# Patient Record
Sex: Male | Born: 1995 | Race: White | Hispanic: No | Marital: Single | Smoking: Never smoker
Health system: Southern US, Community
[De-identification: ages and names within clinical notes are randomized; demographics above are authoritative.]

## PROBLEM LIST (undated history)

## (undated) DIAGNOSIS — E119 Type 2 diabetes mellitus without complications: Secondary | ICD-10-CM

## (undated) DIAGNOSIS — I10 Essential (primary) hypertension: Secondary | ICD-10-CM

---
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
## 2015-08-24 ENCOUNTER — Encounter (HOSPITAL_COMMUNITY): Payer: Self-pay | Admitting: Emergency Medicine

## 2015-08-24 ENCOUNTER — Emergency Department (HOSPITAL_COMMUNITY)
Admission: EM | Admit: 2015-08-24 | Discharge: 2015-08-24 | Disposition: A | Discharge status: Home / Self Care | Payer: Self-pay | Attending: Emergency Medicine | Admitting: Emergency Medicine

## 2015-08-24 DIAGNOSIS — R079 Chest pain, unspecified: Secondary | ICD-10-CM | POA: Insufficient documentation

## 2015-08-24 DIAGNOSIS — R630 Anorexia: Secondary | ICD-10-CM | POA: Insufficient documentation

## 2015-08-24 DIAGNOSIS — R0682 Tachypnea, not elsewhere classified: Secondary | ICD-10-CM | POA: Insufficient documentation

## 2015-08-24 DIAGNOSIS — R112 Nausea with vomiting, unspecified: Secondary | ICD-10-CM | POA: Insufficient documentation

## 2015-08-24 LAB — COMPREHENSIVE METABOLIC PANEL
ALBUMIN: 4.7 g/dL (ref 3.5–5.0)
ALK PHOS: 47 U/L (ref 38–126)
ALT: 17 U/L (ref 17–63)
ANION GAP: 18 — AB (ref 5–15)
AST: 28 U/L (ref 15–41)
BILIRUBIN TOTAL: 0.7 mg/dL (ref 0.3–1.2)
BUN: 8 mg/dL (ref 6–20)
CALCIUM: 10 mg/dL (ref 8.9–10.3)
CO2: 17 mmol/L — ABNORMAL LOW (ref 22–32)
Chloride: 105 mmol/L (ref 101–111)
Creatinine, Ser: 1.1 mg/dL (ref 0.61–1.24)
Glucose, Bld: 146 mg/dL — ABNORMAL HIGH (ref 65–99)
POTASSIUM: 3.7 mmol/L (ref 3.5–5.1)
Sodium: 140 mmol/L (ref 135–145)
TOTAL PROTEIN: 7.8 g/dL (ref 6.5–8.1)

## 2015-08-24 LAB — CBC
HEMATOCRIT: 46 % (ref 39.0–52.0)
HEMOGLOBIN: 15.7 g/dL (ref 13.0–17.0)
MCH: 29.1 pg (ref 26.0–34.0)
MCHC: 34.1 g/dL (ref 30.0–36.0)
MCV: 85.2 fL (ref 78.0–100.0)
PLATELETS: 305 10*3/uL (ref 150–400)
RBC: 5.4 MIL/uL (ref 4.22–5.81)
RDW: 13.5 % (ref 11.5–15.5)
WBC: 22 10*3/uL — AB (ref 4.0–10.5)

## 2015-08-24 LAB — URINE MICROSCOPIC-ADD ON
RBC / HPF: NONE SEEN RBC/hpf (ref 0–5)
SQUAMOUS EPITHELIAL / LPF: NONE SEEN
WBC UA: NONE SEEN WBC/hpf (ref 0–5)

## 2015-08-24 LAB — RAPID URINE DRUG SCREEN, HOSP PERFORMED
Amphetamines: NOT DETECTED
BARBITURATES: NOT DETECTED
Benzodiazepines: NOT DETECTED
COCAINE: POSITIVE — AB
Opiates: NOT DETECTED
Tetrahydrocannabinol: POSITIVE — AB

## 2015-08-24 LAB — URINALYSIS, ROUTINE W REFLEX MICROSCOPIC
Bilirubin Urine: NEGATIVE
GLUCOSE, UA: NEGATIVE mg/dL
Hgb urine dipstick: NEGATIVE
Ketones, ur: 40 mg/dL — AB
LEUKOCYTES UA: NEGATIVE
Nitrite: NEGATIVE
PROTEIN: 30 mg/dL — AB
Specific Gravity, Urine: 1.022 (ref 1.005–1.030)
pH: 7.5 (ref 5.0–8.0)

## 2015-08-24 LAB — I-STAT TROPONIN, ED: TROPONIN I, POC: 0 ng/mL (ref 0.00–0.08)

## 2015-08-24 LAB — LIPASE, BLOOD: Lipase: 30 U/L (ref 11–51)

## 2015-08-24 MED ORDER — ONDANSETRON 4 MG PO TBDP
ORAL_TABLET | ORAL | Status: AC
  Filled 2015-08-24: qty 1

## 2015-08-24 MED ORDER — SODIUM CHLORIDE 0.9 % IV BOLUS (SEPSIS)
1000.0000 mL | Freq: Once | INTRAVENOUS | Status: AC
  Administered 2015-08-24: 1000 mL via INTRAVENOUS

## 2015-08-24 MED ORDER — ONDANSETRON 4 MG PO TBDP
4.0000 mg | ORAL_TABLET | Freq: Once | ORAL | Status: AC
  Administered 2015-08-24: 4 mg via ORAL

## 2015-08-24 MED ORDER — ONDANSETRON 4 MG PO TBDP
ORAL_TABLET | ORAL | Status: AC
  Administered 2015-08-24: 4 mg via ORAL
  Filled 2015-08-24: qty 1

## 2015-08-24 MED ORDER — ONDANSETRON HCL 4 MG/2ML IJ SOLN
4.0000 mg | Freq: Once | INTRAMUSCULAR | Status: AC
  Administered 2015-08-24: 4 mg via INTRAVENOUS
  Filled 2015-08-24: qty 2

## 2015-08-24 MED ORDER — ONDANSETRON 4 MG PO TBDP
4.0000 mg | ORAL_TABLET | Freq: Once | ORAL | Status: AC | PRN
  Administered 2015-08-24: 4 mg via ORAL

## 2015-08-24 MED ORDER — PANTOPRAZOLE SODIUM 40 MG IV SOLR
40.0000 mg | Freq: Once | INTRAVENOUS | Status: AC
Start: 2015-08-24 — End: 2015-08-24
  Administered 2015-08-24: 40 mg via INTRAVENOUS
  Filled 2015-08-24: qty 40

## 2015-08-24 NOTE — ED Notes (Signed)
Pt sts abd pain and vomiting after drinking large amounts of ETOH last night

## 2015-08-24 NOTE — ED Notes (Signed)
Pt. Vomiting right after he took the Zofran. Continues to vomit.

## 2015-08-24 NOTE — ED Provider Notes (Signed)
CSN: [id number]     Arrival date & time 08/24/15  1159 History   First MD Initiated Contact with Patient 08/24/15 1501     Chief Complaint  Patient presents with  . Emesis     (Consider location/radiation/quality/duration/timing/severity/associated sxs/prior Treatment) HPI  Chase Jones is a 19 y.o. male  with no significant PMH who presents to the Emergency Department complaining of n/v, and generalized abdominal pain beginning this morning. Patient states he drank heavily for his 19st birthday last night, and awoke feeling dehydrated. Per patient, he has thrown up ~ 30-40 times today, but mostly dry-heaving - denies blood. Also admits to chest pain that occurs with emesis or heavy breathing. Denies any drug use last night or tonight.   History reviewed. No pertinent past medical history. History reviewed. No pertinent past surgical history. History reviewed. No pertinent family history. Social History  Substance Use Topics  . Smoking status: Never Smoker   . Smokeless tobacco: None  . Alcohol Use: Yes    Review of Systems  Constitutional: Positive for appetite change (Unable to tolerate PO). Negative for fever, chills and fatigue.  HENT: Negative for congestion, rhinorrhea and sore throat.   Eyes: Negative for visual disturbance.  Respiratory: Negative for cough, shortness of breath and wheezing.   Cardiovascular: Positive for chest pain. Negative for palpitations and leg swelling.  Gastrointestinal: Positive for nausea, vomiting and abdominal pain. Negative for diarrhea and constipation.  Endocrine: Negative for polydipsia and polyuria.  Musculoskeletal: Negative for myalgias, back pain, arthralgias and neck pain.  Skin: Negative for rash.  Neurological: Negative for dizziness, weakness and headaches.      Allergies  Review of patient's allergies indicates no known allergies.  Home Medications   Prior to Admission medications   Not on File   BP 93/38 mmHg   Pulse 108  Temp(Src) 97.7 F (36.5 C) (Oral)  Resp 20  SpO2 98% Physical Exam  Constitutional: He is oriented to person, place, and time. He appears well-developed and well-nourished.  Alert and in no acute distress  HENT:  Head: Normocephalic and atraumatic.  Cardiovascular: Normal rate, regular rhythm, normal heart sounds and intact distal pulses.  Exam reveals no gallop and no friction rub.   No murmur heard. Pulmonary/Chest: Breath sounds normal. No respiratory distress. He has no wheezes. He has no rales. He exhibits tenderness.  Tachypnea  Abdominal: He exhibits no mass. There is no rebound and no guarding.  Generalized abdominal tenderness Bowel sounds positive in all four quadrants  Musculoskeletal:  Moves all extremities well x4  Neurological: He is alert and oriented to person, place, and time.  Skin: Skin is warm and dry. No rash noted.  Psychiatric: He has a normal mood and affect. His behavior is normal. Judgment and thought content normal.  Nursing note and vitals reviewed.   ED Course  Procedures (including critical care time) Labs Review Labs Reviewed  COMPREHENSIVE METABOLIC PANEL - Abnormal; Notable for the following:    CO2 17 (*)    Glucose, Bld 146 (*)    Anion gap 18 (*)    All other components within normal limits  CBC - Abnormal; Notable for the following:    WBC 22.0 (*)    All other components within normal limits  URINALYSIS, ROUTINE W REFLEX MICROSCOPIC (NOT AT [facility]) - Abnormal; Notable for the following:    Ketones, ur 40 (*)    Protein, ur 30 (*)    All other components within normal limits  URINE MICROSCOPIC-ADD ON - Abnormal; Notable for the following:    Bacteria, UA RARE (*)    All other components within normal limits  URINE RAPID DRUG SCREEN, HOSP PERFORMED - Abnormal; Notable for the following:    Cocaine POSITIVE (*)    Tetrahydrocannabinol POSITIVE (*)    All other components within normal limits  LIPASE, BLOOD  I-STAT TROPOININ,  ED    Imaging Review No results found. I have personally reviewed and evaluated these images and lab results as part of my medical decision-making.   EKG Interpretation None      MDM   Final diagnoses:  Nausea and vomiting in adult  Upon exam, pt. Appears very agitated and uncomfortable- will add on UDS -- UDS + for THC, cocaine - EKG and trop ordered. Will give fluids and reassess.   5:14 PM - Patient re-evaluated, was sleeping upon entering room. Still nauseous but feels improved.  Trop 0.0, EKG reassuring.   6:37 PM - patient re-evaluated and much improved. Not nauseous. Tolerating PO. Will dc to home in good and stable condition   Patient discussed with Dr. Adela Lank who agrees with treatment plan.     Chase Picket Kaysha Parsell, PA-C 08/24/15 1842  Melene Plan, DO 08/24/15 2332

## 2015-08-24 NOTE — Discharge Instructions (Signed)
Continue usual home medications Rest, drink plenty of fluids Please follow up with your primary doctor in 2-3 days for discussion of your diagnoses and further evaluation after today's visit; if you do not have a primary care doctor use the resource guide provided to find one; Please return to the ER for chest pain, any new or worsening conditions, any additional concerns.   Emergency Department Resource Guide 1) Find a Doctor and Pay Out of Pocket Although you won't have to find out who is covered by your insurance plan, it is a good idea to ask around and get recommendations. You will then need to call the office and see if the doctor you have chosen will accept you as a new patient and what types of options they offer for patients who are self-pay. Some doctors offer discounts or will set up payment plans for their patients who do not have insurance, but you will need to ask so you aren't surprised when you get to your appointment.  2) Contact Your Local Health Department Not all health departments have doctors that can see patients for sick visits, but many do, so it is worth a call to see if yours does. If you don't know where your local health department is, you can check in your phone book. The CDC also has a tool to help you locate your state's health department, and many state websites also have listings of all of their local health departments.  3) Find a Walk-in Clinic If your illness is not likely to be very severe or complicated, you may want to try a walk in clinic. These are popping up all over the country in pharmacies, drugstores, and shopping centers. They're usually staffed by nurse practitioners or physician assistants that have been trained to treat common illnesses and complaints. They're usually fairly quick and inexpensive. However, if you have serious medical issues or chronic medical problems, these are probably not your best option.  No Primary Care Doctor: - Call Health  Connect at  [phone] - they can help you locate a primary care doctor that  accepts your insurance, provides certain services, etc. - Physician Referral Service- [phone]  Chronic Pain Problems: Organization         Address  Phone   Notes  [facility]  [phone] Patients need to be referred by their primary care doctor.   Medication Assistance: Organization         Address  Phone   Notes  [facility] [address] [place], [place] [address] [phone] --Must be a resident of [place] [facility] [place] -- Must have NO insurance coverage whatsoever (no Medicaid/ Medicare, etc.) -- The pt. MUST have a primary care doctor that directs their care regularly and follows them in the community   MedAssist  [phone]   [employer]  [phone]    Agencies that provide inexpensive medical care: Organization         Address  Phone   Notes  [facility]  [phone]   [facility]    [phone]   [facility] [address] [place], [place] [address] [phone]   [facility] [address], [place] [phone]   Planned Parenthood    [phone]   [facility]    [phone]   [facility]  [address], [place]  Phone:  [phone], Fax:  [phone] Hours of Operation:  9 am - 6 pm, M-F.  Also accepts Medicaid/Medicare and self-pay.  [facility] for [place], [address], [place] Phone: [phone], Fax: [phone]. Hours of Operation:  8:30 am - 5:30 pm, M-F.  Also accepts Medicaid and self-pay.  [facility] [place] [address] [place] Phone: [phone]   [place], [place], [place] [phone], Ext. [phone] Mondays & Thursdays: 7-9 AM.  First 15 patients are seen on a first come, first serve basis.     Christine Providers:  Organization         Address  Phone   Notes  [facility] [address], [place] [phone] Also accepts self-pay patients.  [facility] [id number] [place], [place]  [phone]   [place], [address], [place] [phone]   [facility] [address], [place] [phone]   Lucianne Lei [address], [place]   [phone] Only accepts [place] Access [place] patients after they have their name applied to their card.   Self-Pay (no insurance) in [facility]:  Organization         Address  Phone   Notes  Sickle Cell Patients, [facility] [place] [phone]   [facility] [place] [phone]   [facility] [place]  [place], [place],  [phone]   [facility]/Dr. Osei-Bonsu  [address] [place] or [address], [place] [phone] Phone number for both [place] and [place] locations is the same.  Urgent Medical and [facility] [address] [place] [phone]   [facility] [address] [place] or [address] [phone]   [facility] [address] [place] [phone], phone; [phone], fax Sees patients 1st and 3rd Saturday of every month.  Must not qualify for public or private insurance (i.e. Medicaid, Medicare, [place] Health Choice, Veterans' Benefits)  Household income should be no more than 200% of the poverty level The clinic cannot treat you if you are pregnant or think you are pregnant  Sexually transmitted diseases are not treated at the clinic.    Dental Care: Organization         Address  Phone  Notes  [facility]  Department of [facility] [place] [phone] Accepts children up to age 25 who are enrolled in [place] or [place]; pregnant women with a Medicaid card; and children who have applied for Medicaid or [place] Health Choice, but were declined, whose parents can pay a reduced fee at time of service.  [facility] Department of [facility]  [address], [place] [phone] Accepts children up to age 79 who are enrolled in [place] or [place]; pregnant women with a Medicaid card; and children who have applied for Medicaid or [place] Health Choice, but were declined, whose parents can pay a reduced fee at time of service.  [place] [facility] Access PRO[facility]  [place] [phone] Patients are seen by appointment  only. Walk-ins are not accepted. [place] will see patients 33 years of age and older. Monday - Tuesday (8am-5pm) Most Wednesdays (8:30-5pm) $30 per visit, cash only  [facility] Access [facility]  [address], [facility] [phone] Patients are seen by appointment only. Walk-ins are not accepted. [place] will see patients 12 years of age and older. One Wednesday Evening (Monthly: Volunteer Based).  $30 per visit, cash only  [place]  [phone] for adults; Children under age 69, call Graduate Pediatric Dentistry at [phone]. Children aged 51-14, please call [phone] to request a pediatric application.  Dental services are provided in all areas of dental care including fillings, crowns and bridges, complete and partial dentures, implants, gum treatment, root canals, and extractions. Preventive care is also provided. Treatment is provided to both adults and children. Patients are selected via a lottery and there is often a waiting list.   [facility] [address], [place]  [phone]  [email or web address]   [facility] [address] [place], [place] [phone], Ext. [phone] Second and Fourth Thursday of each month, opens at 6:30 AM; Clinic ends at 9 AM.  Patients are seen on a first-come first-served basis, and a limited number are seen during each clinic.   [facility]  [address] Hillard Danker [place], [place] [phone]   Eligibility Requirements You must have lived in [place], [place], or [place] counties for at least the last three months.   You cannot be eligible for state or federal sponsored [employer], including [employer], [place], or [employer].   You generally cannot be eligible for healthcare insurance through your employer.    How to apply: Eligibility screenings are held every Tuesday and Wednesday afternoon from 1:00 pm until 4:00 pm. You do not need an appointment for the interview!  [facility] [address] [place], [place]   [place]  [place] Department  [place]  [phone]    Behavioral Health Resources in the Community: Intensive Outpatient Programs Organization         Address  Phone  Notes  [place]. [address], [place], [place] [phone]   [facility]patient [address] [place], [place]   ADS: Alcohol & Drug Svcs [address] [place], [place]   [place] [address],  [place], [place] or [phone]   Substance Abuse Resources Organization         Address  Phone  Notes  Alcohol and Drug Services  [phone]   [place]  [phone]   The [place]   Chinita Pester  [phone]   Residential & Outpatient Substance Abuse Program  [phone]   Psychological Services Organization          Address  Phone  Notes  [facility] [place]  [place]  [phone]   [place] [address] [place] [phone] or [phone]    Mobile Crisis Teams Organization         Address  Phone  Notes  [facility], Mobile Crisis Care Unit  [phone]   [facility]ive Psychotherapeutic Services  [address] [place], [place]   [facility] [address], [place] [phone]  Self-Help/Support Groups Organization         Address  Phone             Notes  [facility] [place] - variety of support groups  [place] Call for more information  Narcotics Anonymous (NA), [facility]aring [facility] [address], [employer] [place]  2 meetings at this location   Special educational needs teacher         Address  Phone  Notes  [facility] Residential Treatment [place],    [place]  [phone]   [facility]  [address] [place] [id number], [place], [place]   [place], [place] [phone] Admissions: 8am-3pm M-F  Incentives Substance [place] [address]    [place], [place] [phone]   [facility] [address] [place], [place], [place]   [facility] [address]  [place], [place]   [facility] Programs - Intensive Outpatient [address] Kristeen [address], [place], [place]   [facility] [address]  [place], [place] [phone] or [phone]   [facility] Services (RTS) [address] [place], [place] Accepts Medicaid  [facility] [place] [address]  [place] [phone] Substance Abuse/Addiction Treatment   [facility] Organization         Address  Phone  Notes  [facility]  [phone]   Domenic Schwab, PhD [address] Arlis Porta [place], [place]   [phone] or [phone]   [place], [place] [phone]   [facility] [address] [place], [place] [phone] Insurance/Medicaid/sponsorship through [facility] and Families [address], [address]                                    [place], [place] [phone] [place] [address], [place] [phone]    Dr. Adele Schilder  [phone]   [facility] Dept. 1) [address]  2) [place] 3)  [address], [place] [phone]  [phone]   [place] [phone] or [phone] (After Hours)

## 2019-05-04 ENCOUNTER — Other Ambulatory Visit: Payer: Self-pay

## 2019-05-04 ENCOUNTER — Emergency Department (HOSPITAL_COMMUNITY)
Admission: EM | Admit: 2019-05-04 | Discharge: 2019-05-04 | Disposition: A | Discharge status: Home / Self Care | Payer: Self-pay | Attending: Emergency Medicine | Admitting: Emergency Medicine

## 2019-05-04 DIAGNOSIS — L299 Pruritus, unspecified: Secondary | ICD-10-CM | POA: Insufficient documentation

## 2019-05-04 DIAGNOSIS — R21 Rash and other nonspecific skin eruption: Secondary | ICD-10-CM | POA: Insufficient documentation

## 2019-05-04 DIAGNOSIS — L539 Erythematous condition, unspecified: Secondary | ICD-10-CM

## 2019-05-04 MED ORDER — IBUPROFEN 400 MG PO TABS
600.0000 mg | ORAL_TABLET | Freq: Once | ORAL | Status: AC
  Administered 2019-05-04: 600 mg via ORAL
  Filled 2019-05-04: qty 1

## 2019-05-04 MED ORDER — PREDNISONE 20 MG PO TABS: 40.0000 mg | ORAL_TABLET | Freq: Every day | ORAL | 0 refills | Status: AC

## 2019-05-04 MED ORDER — HYDROXYZINE HCL 25 MG PO TABS: 25.0000 mg | ORAL_TABLET | Freq: Four times a day (QID) | ORAL | 0 refills | Status: AC | PRN

## 2019-05-04 MED ORDER — CEPHALEXIN 250 MG PO CAPS
500.0000 mg | ORAL_CAPSULE | Freq: Once | ORAL | Status: AC
  Administered 2019-05-04: 500 mg via ORAL
  Filled 2019-05-04: qty 2

## 2019-05-04 MED ORDER — CEPHALEXIN 500 MG PO CAPS: 500.0000 mg | ORAL_CAPSULE | Freq: Four times a day (QID) | ORAL | 0 refills | Status: AC

## 2019-05-04 NOTE — ED Provider Notes (Signed)
[facility] Provider Note   CSN: [id number] Arrival date & time: 05/04/19  1832    History   Chief Complaint Chief Complaint  Patient presents with  . Insect Bite    HPI Chase Jones is a 23 y.o. male who presents today for evaluation of redness and pain in both his buttocks, primarily in the left buttock.  He reports that he got into the car and felt something bite him on the left buttock  He reports that it is red and painful.  States that it initially started on the left buttock however has rapidly spread and now includes the upper part of the right buttock.  He reports that it is very itchy and that he has been scratching the area.  He also reports pain.  He has not taken any ibuprofen or Tylenol.  He has tried a general over-the-counter anti-itch cream, he does not know the active ingredient, however that did not help.  He reports that he has significant anxiety at baseline and is not currently getting any treatment.  He denies shortness of breath or fever.  He states that he is unsure what it was that bit/stung him however he is certain he felt something when he sat down.  He says that when he looked at the area he did not see a stinger in the wound.  He denies any new exposures other than the anti-itch cream.  No new detergents, lotions.  He reports that his mother told him that when he was a child and had Benadryl he got more anxious.  He has not had Benadryl since he was a young child.  He denies shortness of breath, fevers.  He reports that it has spread since it happened.  He reports significant itching in the area.  Initially this started on the left buttock however has since spread to the top of the right buttock also.      HPI  No past medical history on file.  There are no active problems to display for this patient.   No past surgical history on file.      Home Medications    Prior to Admission medications    Medication Sig Start Date End Date Taking? Authorizing Provider  cephALEXin (KEFLEX) 500 MG capsule Take 1 capsule (500 mg total) by mouth 4 (four) times daily for 7 days. 05/04/19 05/11/19  Cristina GongHammond, Fritzi Scripter W, PA-C  hydrOXYzine (ATARAX/VISTARIL) 25 MG tablet Take 1-2 tablets (25-50 mg total) by mouth every 6 (six) hours as needed for anxiety or itching. 05/04/19   Cristina GongHammond, Shayley Medlin W, PA-C  predniSONE (DELTASONE) 20 MG tablet Take 2 tablets (40 mg total) by mouth daily for 5 days. 05/04/19 05/09/19  Cristina GongHammond, Gable Odonohue W, PA-C    Family History No family history on file.  Social History Social History   Tobacco Use  . Smoking status: Never Smoker  Substance Use Topics  . Alcohol use: Yes  . Drug use: Yes    Types: Marijuana     Allergies   Benadryl [diphenhydramine]   Review of Systems Review of Systems  Constitutional: Negative for chills and fever.  Respiratory: Negative for cough, chest tightness and shortness of breath.   Gastrointestinal: Negative for abdominal pain, diarrhea, nausea and vomiting.  Genitourinary: Negative for difficulty urinating, discharge, penile pain, penile swelling and scrotal swelling.  Musculoskeletal: Negative for arthralgias and myalgias.  Neurological: Negative for weakness and headaches.  Psychiatric/Behavioral: The patient is nervous/anxious (Baseline per patient).  All other systems reviewed and are negative.    Physical Exam Updated Vital Signs BP (!) 151/95 (BP Location: Right Arm)   Pulse 77   Temp 98.3 F (36.8 C) (Oral)   Resp 18   SpO2 99%   Physical Exam Vitals signs and nursing note reviewed.  Constitutional:      General: He is not in acute distress.    Appearance: He is well-developed. He is not diaphoretic.  HENT:     Head: Normocephalic and atraumatic.  Eyes:     General: No scleral icterus.       Right eye: No discharge.        Left eye: No discharge.     Conjunctiva/sclera: Conjunctivae normal.  Neck:      Musculoskeletal: Normal range of motion.  Cardiovascular:     Rate and Rhythm: Normal rate and regular rhythm.  Pulmonary:     Effort: Pulmonary effort is normal. No respiratory distress.     Breath sounds: No stridor.  Abdominal:     General: There is no distension.  Genitourinary:    Comments: Deferred.  Patient reports redness does not extend into perineal area, scrotum or penis. Musculoskeletal:        General: No deformity.  Skin:    General: Skin is warm and dry.     Comments: The left buttock is red with a subcentimeter shallow scab in the middle.  There is no palpable induration or fluctuance.  The general area is tender to palpation.  There is redness in the superior medial aspect of the right buttock in addition.  There is significant secondary excoriation with surrounding erythema.  There is no abnormal drainage from the area.  Neurological:     General: No focal deficit present.     Mental Status: He is alert.     Motor: No abnormal muscle tone.  Psychiatric:        Mood and Affect: Mood normal.        Behavior: Behavior normal.      ED Treatments / Results  Labs (all labs ordered are listed, but only abnormal results are displayed) Labs Reviewed - No data to display  EKG None  Radiology No results found.  Procedures Procedures (including critical care time)  Medications Ordered in ED Medications  cephALEXin (KEFLEX) capsule 500 mg (500 mg Oral Given 05/04/19 2130)  ibuprofen (ADVIL) tablet 600 mg (600 mg Oral Given 05/04/19 2130)     Initial Impression / Assessment and Plan / ED Course  I have reviewed the triage vital signs and the nursing notes.  Pertinent labs & imaging results that were available during my care of the patient were reviewed by me and considered in my medical decision making (see chart for details).       Patient presents today for evaluation of redness and pain in the left buttock that started after he sat in the car and felt  something poke/bite him.  On exam he does not have significant induration.  His left buttock is red and there is a subcentimeter central scab/ulcer.  There is no discharge from this area.  There is significant secondary excoriation with surrounding erythema.  Recommended Benadryl and prednisone.  Patient states that Benadryl had previously made him very anxious as a child, recommended trial of Benadryl, and he is given a prescription for Atarax in case he does not tolerate the Benadryl.  He also has significant secondary excoriation, while he does not have evidence of  infection currently I suspect that he would develop infection from the excoriation, therefore he is given a prescription for Keflex.  He reports his tetanus shot is up-to-date.  He denies any fevers.  He denies systemic symptoms.  He is afebrile not tachycardic or tachypneic, do not suspect Sirs/sepsis.  I suspect this is primarily an allergic process.  Return precautions were discussed with patient who states their understanding.  At the time of discharge patient denied any unaddressed complaints or concerns.  Patient is agreeable for discharge home.   Final Clinical Impressions(s) / ED Diagnoses   Final diagnoses:  Rash  Redness    ED Discharge Orders         Ordered    cephALEXin (KEFLEX) 500 MG capsule  4 times daily     05/04/19 2114    predniSONE (DELTASONE) 20 MG tablet  Daily     05/04/19 2114    hydrOXYzine (ATARAX/VISTARIL) 25 MG tablet  Every 6 hours PRN     05/04/19 2114           Ollen Gross 05/04/19 2337    Quintella Reichert, MD 05/05/19 0100

## 2019-05-04 NOTE — ED Notes (Signed)
Patient verbalizes understanding of discharge instructions. Opportunity for questioning and answers were provided. Armband removed by staff, pt discharged from ED.  

## 2019-05-04 NOTE — ED Notes (Signed)
Pt bitten possibly by a spider yesterday 5- 6 pm yesterday on left buttock.

## 2019-05-04 NOTE — ED Triage Notes (Signed)
Per pt he had gotten into his car yesterday and felt something bit him and it is on his left butt cheek. Pt says it is black and a rash around it. Pt said it is red and very sensitive.

## 2019-05-04 NOTE — Discharge Instructions (Addendum)
I have given you a prescription for steroids today.  Some common side effects include feelings of extra energy, feeling warm, increased appetite, and stomach upset.  If you are diabetic your sugars may run higher than usual.   Please take Ibuprofen (Advil, motrin) and Tylenol (acetaminophen) to relieve your pain.  You may take up to 600 MG (3 pills) of normal strength ibuprofen every 8 hours as needed.  In between doses of ibuprofen you make take tylenol, up to 1,000 mg (two extra strength pills).  Do not take more than 3,000 mg tylenol in a 24 hour period.  Please check all medication labels as many medications such as pain and cold medications may contain tylenol.  Do not drink alcohol while taking these medications.  Do not take other NSAID'S while taking ibuprofen (such as aleve or naproxen).  Please take ibuprofen with food to decrease stomach upset.  You may have diarrhea from the antibiotics.  It is very important that you continue to take the antibiotics even if you get diarrhea unless a medical professional tells you that you may stop taking them.  If you stop too early the bacteria you are being treated for will become stronger and you may need different, more powerful antibiotics that have more side effects and worsening diarrhea.  Please stay well hydrated and consider probiotics as they may decrease the severity of your diarrhea.   As we discussed I would recommend that you take Benadryl.  Please take 1 to 2 pills (25 to 50 mg) every 6 hours as needed.  In addition to this please take the steroid prescription.  I have given you a prescription for Atarax.  This is a medicine that can help with both anxiety and itching.  Please do not take this at the same time as you are taking the Benadryl.  If the Benadryl causes you too much anxiety then you may take the Atarax once the Benadryl wears off.  You may also apply calamine lotion to the red area.  As we discussed please take pictures to help  monitor and document the red area.  If you develop fevers, vomiting, muscle pains, or you have any concerns please seek additional medical care.  If there has not been a significant improvement in 2 days then please return for a wound check.

## 2023-05-07 ENCOUNTER — Encounter (HOSPITAL_COMMUNITY): Payer: Self-pay

## 2023-05-07 ENCOUNTER — Emergency Department (HOSPITAL_COMMUNITY)
Admission: EM | Admit: 2023-05-07 | Discharge: 2023-05-07 | Disposition: A | Discharge status: Home / Self Care | Payer: Medicaid Other | Attending: Emergency Medicine | Admitting: Emergency Medicine

## 2023-05-07 ENCOUNTER — Emergency Department (HOSPITAL_COMMUNITY): Payer: Medicaid Other

## 2023-05-07 ENCOUNTER — Other Ambulatory Visit: Payer: Self-pay

## 2023-05-07 DIAGNOSIS — R7309 Other abnormal glucose: Secondary | ICD-10-CM | POA: Insufficient documentation

## 2023-05-07 DIAGNOSIS — N201 Calculus of ureter: Secondary | ICD-10-CM

## 2023-05-07 DIAGNOSIS — R1031 Right lower quadrant pain: Secondary | ICD-10-CM | POA: Diagnosis present

## 2023-05-07 DIAGNOSIS — N202 Calculus of kidney with calculus of ureter: Secondary | ICD-10-CM | POA: Insufficient documentation

## 2023-05-07 DIAGNOSIS — N23 Unspecified renal colic: Secondary | ICD-10-CM

## 2023-05-07 HISTORY — DX: Type 2 diabetes mellitus without complications: E11.9

## 2023-05-07 HISTORY — DX: Essential (primary) hypertension: I10

## 2023-05-07 LAB — COMPREHENSIVE METABOLIC PANEL
ALT: 23 U/L (ref 0–44)
AST: 17 U/L (ref 15–41)
Albumin: 4.1 g/dL (ref 3.5–5.0)
Alkaline Phosphatase: 49 U/L (ref 38–126)
Anion gap: 12 (ref 5–15)
BUN: 10 mg/dL (ref 6–20)
CO2: 25 mmol/L (ref 22–32)
Calcium: 9.6 mg/dL (ref 8.9–10.3)
Chloride: 101 mmol/L (ref 98–111)
Creatinine, Ser: 1.15 mg/dL (ref 0.61–1.24)
GFR, Estimated: >60 mL/min (ref >60)
Glucose, Bld: 142 mg/dL — ABNORMAL HIGH (ref 70–99)
Potassium: 3.7 mmol/L (ref 3.5–5.1)
Sodium: 138 mmol/L (ref 135–145)
Total Bilirubin: 0.6 mg/dL (ref 0.3–1.2)
Total Protein: 6.8 g/dL (ref 6.5–8.1)

## 2023-05-07 LAB — URINALYSIS, ROUTINE W REFLEX MICROSCOPIC
Bilirubin Urine: NEGATIVE
Glucose, UA: NEGATIVE mg/dL
Ketones, ur: NEGATIVE mg/dL
Leukocytes,Ua: NEGATIVE
Nitrite: NEGATIVE
Protein, ur: NEGATIVE mg/dL
RBC / HPF: >50 RBC/hpf (ref 0–5)
Specific Gravity, Urine: 1.021 (ref 1.005–1.030)
pH: 6 (ref 5.0–8.0)

## 2023-05-07 LAB — CBC
HCT: 46 % (ref 39.0–52.0)
Hemoglobin: 15 g/dL (ref 13.0–17.0)
MCH: 27.7 pg (ref 26.0–34.0)
MCHC: 32.6 g/dL (ref 30.0–36.0)
MCV: 85 fL (ref 80.0–100.0)
Platelets: 275 10*3/uL (ref 150–400)
RBC: 5.41 MIL/uL (ref 4.22–5.81)
RDW: 12.7 % (ref 11.5–15.5)
WBC: 9.2 10*3/uL (ref 4.0–10.5)
nRBC: 0 % (ref 0.0–0.2)

## 2023-05-07 LAB — LIPASE, BLOOD: Lipase: 38 U/L (ref 11–51)

## 2023-05-07 MED ORDER — KETOROLAC TROMETHAMINE 30 MG/ML IJ SOLN
30.0000 mg | Freq: Once | INTRAMUSCULAR | Status: AC
  Administered 2023-05-07: 30 mg via INTRAVENOUS
  Filled 2023-05-07: qty 1

## 2023-05-07 MED ORDER — OXYCODONE-ACETAMINOPHEN 5-325 MG PO TABS
1.0000 | ORAL_TABLET | Freq: Once | ORAL | Status: AC
  Administered 2023-05-07: 1 via ORAL
  Filled 2023-05-07: qty 1

## 2023-05-07 MED ORDER — TAMSULOSIN HCL 0.4 MG PO CAPS: 0.4000 mg | ORAL_CAPSULE | Freq: Every day | ORAL | 0 refills | Status: AC

## 2023-05-07 MED ORDER — HYDROMORPHONE HCL 1 MG/ML IJ SOLN
0.5000 mg | Freq: Once | INTRAMUSCULAR | Status: AC
  Administered 2023-05-07: 0.5 mg via INTRAVENOUS
  Filled 2023-05-07: qty 1

## 2023-05-07 MED ORDER — ONDANSETRON 8 MG PO TBDP: 8.0000 mg | ORAL_TABLET | Freq: Three times a day (TID) | ORAL | 0 refills | Status: AC | PRN

## 2023-05-07 MED ORDER — MORPHINE SULFATE (PF) 4 MG/ML IV SOLN
4.0000 mg | INTRAVENOUS | Status: AC | PRN
  Administered 2023-05-07 (×3): 4 mg via INTRAVENOUS
  Filled 2023-05-07 (×3): qty 1

## 2023-05-07 MED ORDER — ONDANSETRON HCL 4 MG/2ML IJ SOLN
4.0000 mg | Freq: Once | INTRAMUSCULAR | Status: AC
  Administered 2023-05-07: 4 mg via INTRAVENOUS
  Filled 2023-05-07: qty 2

## 2023-05-07 MED ORDER — SODIUM CHLORIDE 0.9 % IV BOLUS
1000.0000 mL | Freq: Once | INTRAVENOUS | Status: AC
  Administered 2023-05-07: 1000 mL via INTRAVENOUS

## 2023-05-07 MED ORDER — HYDROMORPHONE HCL 2 MG PO TABS: 1.0000 mg | ORAL_TABLET | ORAL | 0 refills | Status: DC | PRN

## 2023-05-07 NOTE — ED Provider Notes (Signed)
[place] EMERGENCY DEPARTMENT AT [facility] Provider Note   CSN: [id number] Arrival date & time: 05/07/23  0236     History  Chief Complaint  Patient presents with   Abdominal Pain    Chase Jones is a 27 y.o. male.  The history is provided by the patient.  Abdominal Pain He has no significant history and had sudden onset tonight of lower abdominal pain with some radiation to the right flank.  There has been mild nausea but no vomiting.  He denies fever, chills, sweats.  He states he had a similar pain 1 other time about a year ago but it resolved and he did not seek medical attention.  He has been given 2 doses of oxycodone-acetaminophen while waiting to come back, without relief.   Home Medications Prior to Admission medications   Medication Sig Start Date End Date Taking? Authorizing Provider  hydrOXYzine (ATARAX/VISTARIL) 25 MG tablet Take 1-2 tablets (25-50 mg total) by mouth every 6 (six) hours as needed for anxiety or itching. 05/04/19   Cristina Gong, PA-C      Allergies    Benadryl [diphenhydramine]    Review of Systems   Review of Systems  Gastrointestinal:  Positive for abdominal pain.  All other systems reviewed and are negative.   Physical Exam Updated Vital Signs BP (!) 149/112 (BP Location: Right Arm)   Pulse (!) 52   Temp 97.6 F (36.4 C)   Resp (!) 27   SpO2 100%  Physical Exam Vitals and nursing note reviewed.   27 year old male, writhing in the bed in pain and hyperventilating. Vital signs are significant for slightly slow heart rate, elevated respiratory rate, and elevated blood pressure. Oxygen saturation is 100%, which is normal. Head is normocephalic and atraumatic. PERRLA, EOMI. Oropharynx is clear. Back is nontender and there is no CVA tenderness. Lungs are clear without rales, wheezes, or rhonchi. Chest is nontender. Heart has regular rate and rhythm without murmur. Abdomen is tender in the suprapubic area but exam  is difficult because of generalized guarding. Extremities have no cyanosis or edema. Skin is warm and dry without rash. Neurologic: Mental status is normal, cranial nerves are intact, moves all extremities equally.  ED Results / Procedures / Treatments   Labs (all labs ordered are listed, but only abnormal results are displayed) Labs Reviewed  COMPREHENSIVE METABOLIC PANEL - Abnormal; Notable for the following components:      Result Value   Glucose, Bld 142 (*)    All other components within normal limits  LIPASE, BLOOD  CBC  URINALYSIS, ROUTINE W REFLEX MICROSCOPIC    EKG None  Radiology No results found.  Procedures Procedures    Medications Ordered in ED Medications  oxyCODONE-acetaminophen (PERCOCET/ROXICET) 5-325 MG per tablet 1 tablet (1 tablet Oral Given 05/07/23 0250)  oxyCODONE-acetaminophen (PERCOCET/ROXICET) 5-325 MG per tablet 1 tablet (1 tablet Oral Given 05/07/23 0423)    ED Course/ Medical Decision Making/ A&P                                 Medical Decision Making Amount and/or Complexity of Data Reviewed Labs: ordered. Radiology: ordered.  Risk Prescription drug management.   Lower abdominal pain of uncertain cause.  Abrupt off set is certainly suggestive of ureterolithiasis and renal colic but he does not have CVA tenderness.  Consider appendicitis, diverticulitis, pyelonephritis, perforated viscus.  These are conditions with significant  risk for morbidity and complications.  I have ordered IV fluids, morphine for pain, ondansetron for nausea and I have ordered a renal stone protocol CT scan of abdomen and pelvis.  He feels significantly better following initial dose of morphine.  I have reexamined him and abdomen is now soft and tenderness seems to be fairly well localized to the right lower quadrant.  I have reviewed and interpreted his laboratory tests, and my interpretation is normal CBC, normal lipase, normal comprehensive metabolic panel except  for elevated random glucose which will need to be followed as an outpatient, urinalysis significant for greater than 50 RBCs strongly suggestive of urolithiasis.  CT scan is pending.  Unfortunately, pain recurred and he needed additional doses of morphine.  These did not give him sufficient relief and I gave him a dose of ketorolac and hydromorphone which did give him adequate relief.  I have reviewed and interpreted his laboratory test, and mine interpretation is microscopic hematuria consistent with urolithiasis, normal lipase, comprehensive metabolic panel significant for elevated random glucose which will need to be followed as an outpatient, normal CBC.  CT scan shows punctate but obstructing stone in the right ureterovesical junction with multiple small right renal calculi.  Have independently viewed the images, and agree with the radiologist's interpretation.  I am discharging him with prescription for tamsulosin, ondansetron oral dissolving tablet, and a small number of hydromorphone tablets.  I am referring him to urology for follow-up.  Final Clinical Impression(s) / ED Diagnoses Final diagnoses:  Ureterolithiasis  Renal colic  Elevated random blood glucose level    Rx / DC Orders ED Discharge Orders          Ordered    tamsulosin (FLOMAX) 0.4 MG CAPS capsule  Daily        05/07/23 0739    HYDROmorphone (DILAUDID) 2 MG tablet  Every 4 hours PRN        05/07/23 0739    ondansetron (ZOFRAN-ODT) 8 MG disintegrating tablet  Every 8 hours PRN        05/07/23 0739              Dione Booze, MD 05/07/23 [phone]

## 2023-05-07 NOTE — ED Notes (Signed)
Mother voice concerns of son being in extreme pain. Would like for him to be seen in triage again. Triage nurse Reita Cliche, RN) notified.

## 2023-05-07 NOTE — Discharge Instructions (Signed)
Drink plenty of fluids.  Return to the emergency department if you develop a fever or if pain is not being adequately controlled at home.  If you do have to return to the emergency department, I suggest you go to was a long hospital emergency department, since that is where all of her urology procedures are done.  Your blood sugar was high.  This may represent prediabetes.  This needs to be monitored closely so that if you do develop diabetes, treatment can be started promptly.

## 2023-05-07 NOTE — ED Notes (Signed)
Pt stated when they tried to urinate they could only produce a minimal amount of urine and that they could not urinate at this time but feel as though they have to.

## 2023-05-07 NOTE — ED Triage Notes (Signed)
Patient reports pain across his abdomen this evening with nausea , no fever or diarrhea .

## 2023-05-10 ENCOUNTER — Emergency Department (HOSPITAL_COMMUNITY)
Admission: EM | Admit: 2023-05-10 | Discharge: 2023-05-10 | Disposition: A | Discharge status: Home / Self Care | Payer: Medicaid Other | Attending: Emergency Medicine | Admitting: Emergency Medicine

## 2023-05-10 ENCOUNTER — Emergency Department (HOSPITAL_COMMUNITY): Payer: Medicaid Other

## 2023-05-10 ENCOUNTER — Encounter (HOSPITAL_COMMUNITY): Payer: Self-pay

## 2023-05-10 ENCOUNTER — Other Ambulatory Visit: Payer: Self-pay

## 2023-05-10 DIAGNOSIS — N23 Unspecified renal colic: Secondary | ICD-10-CM

## 2023-05-10 DIAGNOSIS — R109 Unspecified abdominal pain: Secondary | ICD-10-CM | POA: Diagnosis present

## 2023-05-10 DIAGNOSIS — E860 Dehydration: Secondary | ICD-10-CM

## 2023-05-10 LAB — COMPREHENSIVE METABOLIC PANEL
ALT: 19 U/L (ref 0–44)
AST: 17 U/L (ref 15–41)
Albumin: 3.8 g/dL (ref 3.5–5.0)
Alkaline Phosphatase: 44 U/L (ref 38–126)
Anion gap: 8 (ref 5–15)
BUN: 13 mg/dL (ref 6–20)
CO2: 26 mmol/L (ref 22–32)
Calcium: 9 mg/dL (ref 8.9–10.3)
Chloride: 99 mmol/L (ref 98–111)
Creatinine, Ser: 1.63 mg/dL — ABNORMAL HIGH (ref 0.61–1.24)
GFR, Estimated: 58 mL/min — ABNORMAL LOW (ref >60)
Glucose, Bld: 97 mg/dL (ref 70–99)
Potassium: 4.1 mmol/L (ref 3.5–5.1)
Sodium: 133 mmol/L — ABNORMAL LOW (ref 135–145)
Total Bilirubin: 0.5 mg/dL (ref 0.3–1.2)
Total Protein: 6.9 g/dL (ref 6.5–8.1)

## 2023-05-10 LAB — URINALYSIS, ROUTINE W REFLEX MICROSCOPIC
Bilirubin Urine: NEGATIVE
Glucose, UA: 50 mg/dL — AB
Hgb urine dipstick: NEGATIVE
Ketones, ur: NEGATIVE mg/dL
Leukocytes,Ua: NEGATIVE
Nitrite: NEGATIVE
Protein, ur: NEGATIVE mg/dL
Specific Gravity, Urine: 1.009 (ref 1.005–1.030)
pH: 7 (ref 5.0–8.0)

## 2023-05-10 LAB — CBC WITH DIFFERENTIAL/PLATELET
Abs Immature Granulocytes: 0.02 10*3/uL (ref 0.00–0.07)
Basophils Absolute: 0 10*3/uL (ref 0.0–0.1)
Basophils Relative: 1 %
Eosinophils Absolute: 0.2 10*3/uL (ref 0.0–0.5)
Eosinophils Relative: 3 %
HCT: 42.6 % (ref 39.0–52.0)
Hemoglobin: 14 g/dL (ref 13.0–17.0)
Immature Granulocytes: 0 %
Lymphocytes Relative: 29 %
Lymphs Abs: 2.4 10*3/uL (ref 0.7–4.0)
MCH: 28.1 pg (ref 26.0–34.0)
MCHC: 32.9 g/dL (ref 30.0–36.0)
MCV: 85.5 fL (ref 80.0–100.0)
Monocytes Absolute: 0.9 10*3/uL (ref 0.1–1.0)
Monocytes Relative: 10 %
Neutro Abs: 4.8 10*3/uL (ref 1.7–7.7)
Neutrophils Relative %: 57 %
Platelets: 221 10*3/uL (ref 150–400)
RBC: 4.98 MIL/uL (ref 4.22–5.81)
RDW: 12.3 % (ref 11.5–15.5)
WBC: 8.3 10*3/uL (ref 4.0–10.5)
nRBC: 0 % (ref 0.0–0.2)

## 2023-05-10 MED ORDER — KETOROLAC TROMETHAMINE 30 MG/ML IJ SOLN
15.0000 mg | Freq: Once | INTRAMUSCULAR | Status: AC
  Administered 2023-05-10: 15 mg via INTRAVENOUS
  Filled 2023-05-10: qty 1

## 2023-05-10 MED ORDER — LACTATED RINGERS IV BOLUS
1000.0000 mL | Freq: Once | INTRAVENOUS | Status: AC
  Administered 2023-05-10: 1000 mL via INTRAVENOUS

## 2023-05-10 MED ORDER — HYDROMORPHONE HCL 1 MG/ML IJ SOLN
1.0000 mg | Freq: Once | INTRAMUSCULAR | Status: AC
  Administered 2023-05-10: 1 mg via INTRAVENOUS
  Filled 2023-05-10: qty 1

## 2023-05-10 MED ORDER — ONDANSETRON HCL 4 MG/2ML IJ SOLN
4.0000 mg | Freq: Once | INTRAMUSCULAR | Status: AC
  Administered 2023-05-10: 4 mg via INTRAVENOUS
  Filled 2023-05-10: qty 2

## 2023-05-10 MED ORDER — HYDROMORPHONE HCL 2 MG PO TABS: 1.0000 mg | ORAL_TABLET | ORAL | 0 refills | Status: AC | PRN

## 2023-05-10 NOTE — ED Notes (Signed)
Pt discharged home. Discharge information discussed. No s/s of distress observed during discharge. 

## 2023-05-10 NOTE — Consult Note (Signed)
Urology Consult Note   Requesting Attending Physician:  No att. providers found Service Providing Consult: Urology  Consulting Attending: Dr. Liliane Shi   Reason for Consult:  flank pain  HPI: Chase Jones is seen in consultation for reasons noted above at the request of Dr. Manus Gunning ------------------  Assessment:  27 y.o. 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  Patient originally presented to the emergency department on 8/16 where 1-2 mm UVJ stone was identified.  Patient was sent home on medical expulsive therapy.  His pain has been on and off but began to worsen significantly from his report yesterday.  On assessment today patient has developed an interval AKI with serum creatinine of 1.6.  Urinalysis is unremarkable.  Stone is barely visible on CT right at the UVJ.  Patient is not feverish and there is no concern for systemic infectious process.  He was resting comfortably in bed and accompanied by his mother on my arrival.   Recommendations: #Flank Pain #Ureteral Stone #AKI  Reviewed the high probability of naturally passing a 1 to 2 mm stone.  Discussed the options of watchful waiting versus surgical intervention.  Shared decision was made to continue with medical expulsive therapy and defer cystoscopy with laser lithotripsy at this time.  AKI is almost certainly attributable to dehydration.  Even complete obstruction of the kidney in a healthy young man is unlikely to cause an AKI.  Patient's mother reports that he was unable to establish care with our practice due to insurance limitations.  I have encouraged them to contact their insurance company and find someone in network to establish with a soon as possible.  Patient will return to hospital for fever greater than 100.4, intractable nausea and vomiting, or intractable pain.  Case and plan discussed with Dr. Liliane Shi  Past Medical History: Past Medical History:  Diagnosis Date    Diabetes mellitus without complication (HCC)    Hypertension     Past Surgical History:  History reviewed. No pertinent surgical history.  Medication: No current facility-administered medications for this encounter.   Current Outpatient Medications  Medication Sig Dispense Refill   HYDROmorphone (DILAUDID) 2 MG tablet Take 0.5-1 tablets (1-2 mg total) by mouth every 4 (four) hours as needed for severe pain or moderate pain. 15 tablet 0   hydrOXYzine (ATARAX/VISTARIL) 25 MG tablet Take 1-2 tablets (25-50 mg total) by mouth every 6 (six) hours as needed for anxiety or itching. 20 tablet 0   ondansetron (ZOFRAN-ODT) 8 MG disintegrating tablet Take 1 tablet (8 mg total) by mouth every 8 (eight) hours as needed for nausea or vomiting. 20 tablet 0   tamsulosin (FLOMAX) 0.4 MG CAPS capsule Take 1 capsule (0.4 mg total) by mouth daily. 10 capsule 0    Allergies: Allergies  Allergen Reactions   Benadryl [Diphenhydramine] Anxiety    Social History: Social History   Tobacco Use   Smoking status: Never  Substance Use Topics   Alcohol use: Yes   Drug use: Yes    Types: Marijuana    Family History History reviewed. No pertinent family history.  Review of Systems  Genitourinary:  Positive for flank pain.     Objective   Vital signs in last 24 hours: BP (!) 134/93 (BP Location: Right Arm)   Pulse 98   Temp 98.1 F (36.7 C) (Oral)   Resp 18   SpO2 100%   Physical Exam General: NAD, A&O, resting, appropriate HEENT: /AT Pulmonary: Normal work  of breathing Cardiovascular: RRR, no cyanosis Abdomen: Soft, NTTP, nondistended  Most Recent Labs: Lab Results  Component Value Date   WBC 8.3 05/10/2023   HGB 14.0 05/10/2023   HCT 42.6 05/10/2023   PLT 221 05/10/2023    Lab Results  Component Value Date   NA 133 (L) 05/10/2023   K 4.1 05/10/2023   CL 99 05/10/2023   CO2 26 05/10/2023   BUN 13 05/10/2023   CREATININE 1.63 (H) 05/10/2023   CALCIUM 9.0 05/10/2023     No results found for: "INR", "APTT"   Urine Culture: @[id number] (laburin,org,[id number])@   IMAGING: CT Renal Stone Study  Result Date: 05/10/2023 CLINICAL DATA:  27 year old male with history of abdominal and flank pain. Suspected kidney stone. EXAM: CT ABDOMEN AND PELVIS WITHOUT CONTRAST TECHNIQUE: Multidetector CT imaging of the abdomen and pelvis was performed following the standard protocol without IV contrast. RADIATION DOSE REDUCTION: This exam was performed according to the departmental dose-optimization program which includes automated exposure control, adjustment of the mA and/or kV according to patient size and/or use of iterative reconstruction technique. COMPARISON:  CT the abdomen and pelvis 05/07/2023. FINDINGS: Lower chest: Unremarkable. Hepatobiliary: No definite suspicious cystic or solid hepatic lesions are confidently identified on today's noncontrast CT examination. Unenhanced appearance of the gallbladder is unremarkable. Pancreas: No definite pancreatic mass or peripancreatic fluid collections or inflammatory changes are noted on today's noncontrast CT examination. Spleen: Unremarkable. Adrenals/Urinary Tract: At the right ureterovesicular junction (axial image 76 of series 2) there is a 2 mm calculus. This is associated with moderate proximal hydroureter. There is moderate to severe hydronephrosis as well, with prominence of the right renal pelvis, which is likely in part related to an extrarenal pelvis (normal anatomical variant). Multiple additional tiny 1-2 mm nonobstructive calculi are noted within the right renal collecting system. Perinephric stranding surrounding the right kidney. Left kidney and bilateral adrenal glands are normal in appearance. No left hydroureteronephrosis. Urinary bladder is unremarkable in appearance. Stomach/Bowel: Unenhanced appearance of the stomach is unremarkable. No pathologic dilatation of small bowel or colon. Normal appendix.  Vascular/Lymphatic: No atherosclerotic calcifications are noted in the abdominal aorta or pelvic vasculature. No lymphadenopathy noted in the abdomen or pelvis. Reproductive: Prostate gland and seminal vesicles are unremarkable in appearance. Other: No significant volume of ascites.  No pneumoperitoneum. Musculoskeletal: There are no aggressive appearing lytic or blastic lesions noted in the visualized portions of the skeleton. IMPRESSION: 1. Persistent 2 mm calculus at the right ureterovesicular junction with increasing proximal right hydroureteronephrosis, as above, indicating persistent and worsening obstruction. 2. Multiple additional tiny 1-2 mm nonobstructive calculi in the right renal collecting system. Electronically Signed   By: Trudie Reed M.D.   On: 05/10/2023 06:50    ------  Elmon Kirschner, NP Pager: [phone]   Please contact the urology consult pager with any further questions/concerns.

## 2023-05-10 NOTE — ED Triage Notes (Signed)
Pt. Arrives for back pain from kidney stones that he was seen for on the 16th. Pt. Was told to come into the ED if he felt that he was getting worse.

## 2023-05-10 NOTE — ED Provider Notes (Signed)
Pt signed out by Dr. Manus Gunning pending urology eval.  Pt d/w urology who feels that pt can go home.  Pt's aki is likely due to not drinking enough.  He is encouraged to drink lots of fluids.  Return if worse.    Jacalyn Lefevre, MD 05/10/23 [phone]

## 2023-05-10 NOTE — Discharge Instructions (Addendum)
Take the pain and nausea medication as prescribed.  Follow-up with the urologist.  Return to the ED for worsening pain, fever, vomiting, not able to urinate or other concerns.

## 2023-05-10 NOTE — ED Provider Notes (Signed)
[name] EMERGENCY DEPARTMENT AT [facility] Provider Note   CSN: [id number] Arrival date & time: 05/10/23  2725     History  No chief complaint on file.   Chase Jones is a 27 y.o. male.  Patient return with progressively worsening right-sided flank pain that radiates to his right lower abdomen.  He was diagnosed with a kidney stone on the 16th and prescribed hydromorphone, Zofran and Flomax.  He has been taking his medications but still having severe pain.  Still having pain and burning with urination with nausea and right flank pain.  Attempted to follow-up with urology but was not in their network.  No fever.  No chills.  No abdominal pain.  No chest pain or shortness of breath.  Pain is to his right flank and wraps around to his right lower abdomen.  No testicular pain currently.  He had cut back his pain medication to a half a tablet but had to go back up to a whole tablet earlier this evening.  The history is provided by the patient.       Home Medications Prior to Admission medications   Medication Sig Start Date End Date Taking? Authorizing Provider  HYDROmorphone (DILAUDID) 2 MG tablet Take 0.5-1 tablets (1-2 mg total) by mouth every 4 (four) hours as needed for severe pain or moderate pain. 05/07/23   Dione Booze, MD  hydrOXYzine (ATARAX/VISTARIL) 25 MG tablet Take 1-2 tablets (25-50 mg total) by mouth every 6 (six) hours as needed for anxiety or itching. 05/04/19   Cristina Gong, PA-C  ondansetron (ZOFRAN-ODT) 8 MG disintegrating tablet Take 1 tablet (8 mg total) by mouth every 8 (eight) hours as needed for nausea or vomiting. 05/07/23   Dione Booze, MD  tamsulosin (FLOMAX) 0.4 MG CAPS capsule Take 1 capsule (0.4 mg total) by mouth daily. 05/07/23   Dione Booze, MD      Allergies    Benadryl [diphenhydramine]    Review of Systems   Review of Systems  Constitutional:  Negative for activity change, appetite change and fever.  HENT:  Negative for  congestion.   Respiratory:  Negative for cough, chest tightness and shortness of breath.   Cardiovascular:  Negative for chest pain.  Gastrointestinal:  Negative for abdominal pain, nausea and vomiting.  Genitourinary:  Positive for dysuria, flank pain, frequency and urgency. Negative for hematuria and testicular pain.  Musculoskeletal:  Positive for arthralgias and back pain. Negative for myalgias.  Skin:  Negative for rash.  Neurological:  Negative for dizziness, weakness and headaches.   all other systems are negative except as noted in the HPI and PMH.    Physical Exam Updated Vital Signs BP (!) 171/102 (BP Location: Right Arm)   Pulse (!) 118   Temp 98.7 F (37.1 C) (Oral)   Resp 17   SpO2 100%  Physical Exam Vitals and nursing note reviewed.  Constitutional:      General: He is not in acute distress.    Appearance: He is well-developed.  HENT:     Head: Normocephalic and atraumatic.     Mouth/Throat:     Pharynx: No oropharyngeal exudate.  Eyes:     Conjunctiva/sclera: Conjunctivae normal.     Pupils: Pupils are equal, round, and reactive to light.  Neck:     Comments: No meningismus. Cardiovascular:     Rate and Rhythm: Normal rate and regular rhythm.     Heart sounds: Normal heart sounds. No murmur heard. Pulmonary:  Effort: Pulmonary effort is normal. No respiratory distress.     Breath sounds: Normal breath sounds.  Abdominal:     Palpations: Abdomen is soft.     Tenderness: There is no abdominal tenderness. There is no guarding or rebound.  Musculoskeletal:        General: Tenderness present. Normal range of motion.     Cervical back: Normal range of motion and neck supple.     Comments:  R CVAT  Skin:    General: Skin is warm.  Neurological:     Mental Status: He is alert and oriented to person, place, and time.     Cranial Nerves: No cranial nerve deficit.     Motor: No abnormal muscle tone.     Coordination: Coordination normal.     Comments:  5/5  strength throughout. CN 2-12 intact.Equal grip strength.   Psychiatric:        Behavior: Behavior normal.     ED Results / Procedures / Treatments   Labs (all labs ordered are listed, but only abnormal results are displayed) Labs Reviewed  URINALYSIS, ROUTINE W REFLEX MICROSCOPIC - Abnormal; Notable for the following components:      Result Value   Color, Urine STRAW (*)    Glucose, UA 50 (*)    All other components within normal limits  COMPREHENSIVE METABOLIC PANEL - Abnormal; Notable for the following components:   Sodium 133 (*)    Creatinine, Ser 1.63 (*)    GFR, Estimated 58 (*)    All other components within normal limits  CBC WITH DIFFERENTIAL/PLATELET    EKG None  Radiology CT Renal Stone Study  Result Date: 05/10/2023 CLINICAL DATA:  27 year old male with history of abdominal and flank pain. Suspected kidney stone. EXAM: CT ABDOMEN AND PELVIS WITHOUT CONTRAST TECHNIQUE: Multidetector CT imaging of the abdomen and pelvis was performed following the standard protocol without IV contrast. RADIATION DOSE REDUCTION: This exam was performed according to the departmental dose-optimization program which includes automated exposure control, adjustment of the mA and/or kV according to patient size and/or use of iterative reconstruction technique. COMPARISON:  CT the abdomen and pelvis 05/07/2023. FINDINGS: Lower chest: Unremarkable. Hepatobiliary: No definite suspicious cystic or solid hepatic lesions are confidently identified on today's noncontrast CT examination. Unenhanced appearance of the gallbladder is unremarkable. Pancreas: No definite pancreatic mass or peripancreatic fluid collections or inflammatory changes are noted on today's noncontrast CT examination. Spleen: Unremarkable. Adrenals/Urinary Tract: At the right ureterovesicular junction (axial image 76 of series 2) there is a 2 mm calculus. This is associated with moderate proximal hydroureter. There is moderate to severe  hydronephrosis as well, with prominence of the right renal pelvis, which is likely in part related to an extrarenal pelvis (normal anatomical variant). Multiple additional tiny 1-2 mm nonobstructive calculi are noted within the right renal collecting system. Perinephric stranding surrounding the right kidney. Left kidney and bilateral adrenal glands are normal in appearance. No left hydroureteronephrosis. Urinary bladder is unremarkable in appearance. Stomach/Bowel: Unenhanced appearance of the stomach is unremarkable. No pathologic dilatation of small bowel or colon. Normal appendix. Vascular/Lymphatic: No atherosclerotic calcifications are noted in the abdominal aorta or pelvic vasculature. No lymphadenopathy noted in the abdomen or pelvis. Reproductive: Prostate gland and seminal vesicles are unremarkable in appearance. Other: No significant volume of ascites.  No pneumoperitoneum. Musculoskeletal: There are no aggressive appearing lytic or blastic lesions noted in the visualized portions of the skeleton. IMPRESSION: 1. Persistent 2 mm calculus at the right ureterovesicular junction  with increasing proximal right hydroureteronephrosis, as above, indicating persistent and worsening obstruction. 2. Multiple additional tiny 1-2 mm nonobstructive calculi in the right renal collecting system. Electronically Signed   By: Trudie Reed M.D.   On: 05/10/2023 06:50    Procedures Procedures    Medications Ordered in ED Medications  lactated ringers bolus 1,000 mL (has no administration in time range)  ondansetron (ZOFRAN) injection 4 mg (has no administration in time range)  HYDROmorphone (DILAUDID) injection 1 mg (has no administration in time range)  ketorolac (TORADOL) 30 MG/ML injection 15 mg (has no administration in time range)    ED Course/ Medical Decision Making/ A&P                                 Medical Decision Making Amount and/or Complexity of Data Reviewed Labs: ordered.  Decision-making details documented in ED Course. Radiology: ordered and independent interpretation performed. Decision-making details documented in ED Course. ECG/medicine tests: ordered and independent interpretation performed. Decision-making details documented in ED Course.  Risk Prescription drug management.   Known kidney stone with worsening flank pain, nausea and vomiting.  Tachycardic on arrival.  Abdomen soft without peritoneal signs.  Urinalysis is negative for infection and negative for blood.  Creatinine has worsened to 1.6 from 1.1.  CT scan today shows stone still at UVJ on the right side with worsening hydronephrosis and worsening obstruction.  With worsening pain and worsening kidney function, discussed with urology NP [place].   No evidence of infection or fever.  Urology will evaluate for possible stent placement today. Dr. Particia Nearing aware of patient at shift change.         Final Clinical Impression(s) / ED Diagnoses Final diagnoses:  Ureteral colic    Rx / DC Orders ED Discharge Orders     None         Marcus Groll, Jeannett Senior, MD 05/10/23 [phone]

## 2023-08-10 ENCOUNTER — Emergency Department (HOSPITAL_COMMUNITY): Payer: Medicaid Other

## 2023-08-10 ENCOUNTER — Emergency Department (HOSPITAL_COMMUNITY)
Admission: EM | Admit: 2023-08-10 | Discharge: 2023-08-10 | Disposition: A | Discharge status: Home / Self Care | Payer: Medicaid Other | Attending: Emergency Medicine | Admitting: Emergency Medicine

## 2023-08-10 DIAGNOSIS — W260XXA Contact with knife, initial encounter: Secondary | ICD-10-CM | POA: Diagnosis not present

## 2023-08-10 DIAGNOSIS — S6992XA Unspecified injury of left wrist, hand and finger(s), initial encounter: Secondary | ICD-10-CM | POA: Diagnosis present

## 2023-08-10 DIAGNOSIS — Z23 Encounter for immunization: Secondary | ICD-10-CM | POA: Diagnosis not present

## 2023-08-10 DIAGNOSIS — I10 Essential (primary) hypertension: Secondary | ICD-10-CM | POA: Diagnosis not present

## 2023-08-10 DIAGNOSIS — E119 Type 2 diabetes mellitus without complications: Secondary | ICD-10-CM | POA: Diagnosis not present

## 2023-08-10 DIAGNOSIS — S61512A Laceration without foreign body of left wrist, initial encounter: Secondary | ICD-10-CM | POA: Insufficient documentation

## 2023-08-10 MED ORDER — LORAZEPAM 2 MG/ML IJ SOLN: 0.5000 mg | Freq: Once | INTRAMUSCULAR | Status: DC

## 2023-08-10 MED ORDER — CEPHALEXIN 250 MG PO CAPS
500.0000 mg | ORAL_CAPSULE | Freq: Once | ORAL | Status: AC
  Administered 2023-08-10: 500 mg via ORAL
  Filled 2023-08-10: qty 2

## 2023-08-10 MED ORDER — OXYCODONE-ACETAMINOPHEN 5-325 MG PO TABS
1.0000 | ORAL_TABLET | Freq: Once | ORAL | Status: AC
  Administered 2023-08-10: 1 via ORAL
  Filled 2023-08-10: qty 1

## 2023-08-10 MED ORDER — OXYCODONE-ACETAMINOPHEN 5-325 MG PO TABS
1.0000 | ORAL_TABLET | Freq: Three times a day (TID) | ORAL | 0 refills | Status: AC | PRN
Start: 2023-08-10 — End: 2023-08-13

## 2023-08-10 MED ORDER — TETANUS-DIPHTH-ACELL PERTUSSIS 5-2.5-18.5 LF-MCG/0.5 IM SUSY
0.5000 mL | PREFILLED_SYRINGE | Freq: Once | INTRAMUSCULAR | Status: AC
  Administered 2023-08-10: 0.5 mL via INTRAMUSCULAR
  Filled 2023-08-10: qty 0.5

## 2023-08-10 MED ORDER — CEPHALEXIN 250 MG PO CAPS: 250.0000 mg | ORAL_CAPSULE | Freq: Once | ORAL | Status: DC

## 2023-08-10 MED ORDER — CEPHALEXIN 500 MG PO CAPS: 500.0000 mg | ORAL_CAPSULE | Freq: Four times a day (QID) | ORAL | 0 refills | Status: AC

## 2023-08-10 MED ORDER — LIDOCAINE-EPINEPHRINE (PF) 2 %-1:200000 IJ SOLN
20.0000 mL | Freq: Once | INTRAMUSCULAR | Status: AC
  Administered 2023-08-10: 20 mL
  Filled 2023-08-10: qty 20

## 2023-08-10 NOTE — Progress Notes (Signed)
Orthopedic Tech Progress Note Patient Details:  EMBER PULLUM 12/23/1995 [id number]  Ortho Devices Type of Ortho Device: Volar splint Ortho Device/Splint Location: LUE Ortho Device/Splint Interventions: Ordered, Application, Adjustment   Post Interventions Patient Tolerated: Well Instructions Provided: Care of device Spoke with ordering provider before splint application and order verbally changed to high volar over sugartong. [place] A Gerilyn Pilgrim 08/10/2023, 10:50 PM

## 2023-08-10 NOTE — ED Provider Notes (Signed)
[place] EMERGENCY DEPARTMENT AT [facility] Provider Note   CSN: [id number] Arrival date & time: 08/10/23  1853     History  Chief Complaint  Patient presents with   Laceration    Chase Jones is a 27 y.o. male with a history of diabetes mellitus and hypertension who presents the ED today for laceration.  Patient reports that he was cutting a zip tie with a knife earlier today when he cut the dorsal aspect of his left wrist.  He is able to flex his fingers but is unable to extend any of them.  Sensation remains intact. He is right hand dominant. No other injuries. Tetanus is not up-to-date.    Home Medications Prior to Admission medications   Medication Sig Start Date End Date Taking? Authorizing Provider  cephALEXin (KEFLEX) 500 MG capsule Take 1 capsule (500 mg total) by mouth 4 (four) times daily for 5 days. 08/10/23 08/15/23 Yes Maxwell Marion, PA-C  oxyCODONE-acetaminophen (PERCOCET/ROXICET) 5-325 MG tablet Take 1 tablet by mouth every 8 (eight) hours as needed for up to 3 days for severe pain (pain score 7-10). 08/10/23 08/13/23 Yes Maxwell Marion, PA-C  HYDROmorphone (DILAUDID) 2 MG tablet Take 0.5-1 tablets (1-2 mg total) by mouth every 4 (four) hours as needed for severe pain or moderate pain. 05/10/23   Jacalyn Lefevre, MD  hydrOXYzine (ATARAX/VISTARIL) 25 MG tablet Take 1-2 tablets (25-50 mg total) by mouth every 6 (six) hours as needed for anxiety or itching. 05/04/19   Cristina Gong, PA-C  ondansetron (ZOFRAN-ODT) 8 MG disintegrating tablet Take 1 tablet (8 mg total) by mouth every 8 (eight) hours as needed for nausea or vomiting. 05/07/23   Dione Booze, MD  tamsulosin (FLOMAX) 0.4 MG CAPS capsule Take 1 capsule (0.4 mg total) by mouth daily. 05/07/23   Dione Booze, MD      Allergies    Benadryl [diphenhydramine]    Review of Systems   Review of Systems  Skin:        Laceration to left dorsal wrist  All other systems reviewed and are  negative.   Physical Exam Updated Vital Signs BP (!) 116/95   Pulse 88   Temp 97.8 F (36.6 C) (Oral)   Resp 17   SpO2 100%  Physical Exam Vitals and nursing note reviewed.  Constitutional:      Appearance: Normal appearance.  HENT:     Head: Normocephalic and atraumatic.     Mouth/Throat:     Mouth: Mucous membranes are moist.  Eyes:     Conjunctiva/sclera: Conjunctivae normal.     Pupils: Pupils are equal, round, and reactive to light.  Cardiovascular:     Rate and Rhythm: Normal rate and regular rhythm.     Pulses: Normal pulses.  Pulmonary:     Effort: Pulmonary effort is normal.  Abdominal:     Palpations: Abdomen is soft.     Tenderness: There is no abdominal tenderness.  Musculoskeletal:        General: Signs of injury present.     Comments: 4 cm laceration at the dorsum of the left wrist. He is able to flex his fingers but unable to extend them on his own. Palpable radial pulse and capillary refill within 2 seconds. Sensation intact.  Skin:    General: Skin is warm and dry.     Findings: No rash.  Neurological:     General: No focal deficit present.     Mental Status: He is alert.  Psychiatric:        Mood and Affect: Mood normal.        Behavior: Behavior normal.    ED Results / Procedures / Treatments   Labs (all labs ordered are listed, but only abnormal results are displayed) Labs Reviewed - No data to display  EKG None  Radiology DG Wrist Complete Left  Result Date: 08/10/2023 CLINICAL DATA:  Laceration. EXAM: LEFT WRIST - COMPLETE 3+ VIEW COMPARISON:  None Available. FINDINGS: There is no evidence of fracture or dislocation. There is no evidence of arthropathy or other focal bone abnormality. Soft tissue defect over the dorsum of the wrist, likely site of laceration. No radiopaque foreign body. IMPRESSION: Soft tissue defect over the dorsum of the wrist, likely site of laceration. No radiopaque foreign body or fracture. Electronically Signed   By:  Narda Rutherford M.D.   On: 08/10/2023 22:38    Procedures .Marland KitchenLaceration Repair  Date/Time: 08/10/2023 11:37 PM  Performed by: Arlyss Gandy, Student-PA Authorized by: Maxwell Marion, PA-C   Consent:    Consent obtained:  Verbal   Consent given by:  Patient   Risks discussed:  Infection and pain Anesthesia:    Anesthesia method:  Local infiltration Laceration details:    Location:  Shoulder/arm   Shoulder/arm location:  L lower arm   Length (cm):  4 Exploration:    Hemostasis achieved with:  Epinephrine   Imaging obtained: x-ray     Imaging outcome: foreign body not noted   Treatment:    Area cleansed with:  Saline Skin repair:    Repair method:  Sutures   Suture size:  5-0   Suture material:  Prolene   Suture technique:  Simple interrupted   Number of sutures:  3 Approximation:    Approximation:  Loose Repair type:    Repair type:  Simple Post-procedure details:    Dressing:  Splint for protection   Procedure completion:  Tolerated well, no immediate complications Comments:     Loose sutures applied per hand surgery's request     Medications Ordered in ED Medications  Tdap (BOOSTRIX) injection 0.5 mL (0.5 mLs Intramuscular Given 08/10/23 2021)  oxyCODONE-acetaminophen (PERCOCET/ROXICET) 5-325 MG per tablet 1 tablet (1 tablet Oral Given 08/10/23 2020)  lidocaine-EPINEPHrine (XYLOCAINE W/EPI) 2 %-1:200000 (PF) injection 20 mL (20 mLs Infiltration Given 08/10/23 2219)  cephALEXin (KEFLEX) capsule 500 mg (500 mg Oral Given 08/10/23 2313)    ED Course/ Medical Decision Making/ A&P                                 Medical Decision Making Risk Prescription drug management.   This patient presents to the ED for concern of wrist laceration, this involves an extensive number of treatment options, and is a complaint that carries with it a high risk of complications and morbidity.   Differential diagnosis includes: laceration, etc.   Comorbidities  See HPI  above   Additional History  Additional history obtained from prior records.   Imaging Studies  I ordered imaging studies including left wrist x-ray  I independently visualized and interpreted imaging which showed: Soft tissue defect over the dorsum of the wrist.  No radiopaque foreign body or fracture. I agree with the radiologist interpretation   Consultations  I requested consultation with Dr. Janee Morn with hand surgery,  and discussed lab and imaging findings as well as pertinent plan - they recommend: close laceration loosely, splint  with fingers in extension, start on antibiotics, and his office will call him tomorrow.   Problem List / ED Course / Critical Interventions / Medication Management  Left wrist laceration I ordered medications including: Percocet for pain Tdap updated First dose Keflex given.  Reevaluation of the patient after these medicines showed that the patient improved I have reviewed the patients home medicines and have made adjustments as needed Ortho tech came and splinted patient prior to discharge.   Social Determinants of Health  Physical activity   Test / Admission - Considered  Discussed findings with patient.  He is hemodynamically stable and safe for discharge home. Return precautions given.        Final Clinical Impression(s) / ED Diagnoses Final diagnoses:  Wrist laceration, left, initial encounter    Rx / DC Orders ED Discharge Orders          Ordered    cephALEXin (KEFLEX) 500 MG capsule  4 times daily        08/10/23 2308    oxyCODONE-acetaminophen (PERCOCET/ROXICET) 5-325 MG tablet  Every 8 hours PRN        08/10/23 2308              Maxwell Marion, PA-C 08/10/23 2343    Franne Forts, DO 08/21/23 1047

## 2023-08-10 NOTE — ED Provider Triage Note (Signed)
Emergency Medicine Provider Triage Evaluation Note  Chase Jones , a 27 y.o. male  was evaluated in triage.  Pt complains of laceration to the dorsal wrist pta while trying to cut a zip tie.  Review of Systems  Positive: Laceration Negative:   Physical Exam  BP (!) 141/102 (BP Location: Right Arm)   Pulse (!) 113   Temp 97.8 F (36.6 C) (Oral)   Resp 18   SpO2 100%  Gen:   Awake, no distress   Resp:  Normal effort  MSK:              Intact strength to flexion of all digits on the affected left upper extremity, but unable to extend the digits actively, he is forcing his hand open with a soda or his other palm because it feels crampy if he lets it stay closed for too long Other:  Laceration noted to the dorsum of the left wrist, some oozing at this time, radial, ulnar pulses 2+ in the affected extremity  Medical Decision Making  Medically screening exam initiated at 7:16 PM.  Appropriate orders placed.  Chase Jones was informed that the remainder of the evaluation will be completed by another provider, this initial triage assessment does not replace that evaluation, and the importance of remaining in the ED until their evaluation is complete.  Workup initiated in triage    Olene Floss, [place] 08/10/23 1916

## 2023-08-10 NOTE — Discharge Instructions (Addendum)
As discussed, your imaging does not show signs of fracture. You will receive a call from Dr. Carollee Massed office (hand surgery) to follow up with in the next several days.  Take Keflex four times a day for the next 5 days for infection prevention. Alternate Tylenol and Ibuprofen every 4 hours as needed for pain. Take Percocet up to 3 times a day as needed for breakthrough pain.  Get help right away if: You have very bad swelling around the wound. Your pain suddenly gets worse and is very bad. You have painful lumps near the wound or on skin anywhere on your body. You have a red streak going away from your wound. The wound is on your hand or foot, and: You cannot move a finger or toe. Your fingers or toes look pale or bluish.

## 2023-08-10 NOTE — ED Triage Notes (Addendum)
Pt to ED POV. Pt states he accidentally cut his left outer wrist PTA with a butcher knife. Pt reports laceration all the way down to bone. Pt states his aunt wrapped wrist PTA. Laceration 1-2 inches long. Bleeding controlled. Unknown when last tetanus shot was.

## 2023-08-23 NOTE — Therapy (Signed)
OUTPATIENT OCCUPATIONAL THERAPY ORTHO EVALUATION  Patient Name: Chase Jones MRN: [id number] DOB:02/12/1996, 27 y.o., male Today's Date: 08/25/2023  PCP: none REFERRING PROVIDER: Mack Hook, MD  END OF SESSION:  OT End of Session - 08/25/23 0904     Visit Number 1    Number of Visits 12    Date for OT Re-Evaluation 10/26/23    Authorization Type [employer] - awaiting auth    OT Start Time 0805    OT Stop Time 0915    OT Time Calculation (min) 70 min    Activity Tolerance Patient tolerated treatment well    Behavior During Therapy [facility] for tasks assessed/performed             Past Medical History:  Diagnosis Date   Diabetes mellitus without complication (HCC)    Hypertension    History reviewed. No pertinent surgical history. There are no problems to display for this patient.   ONSET DATE: 08/18/2023  REFERRING DIAG: Lt wrist EDC (4), and EIP, EDQ  repair -DOS 08/18/23  THERAPY DIAG:  Pain in left wrist  Stiffness of left wrist, not elsewhere classified  Muscle weakness (generalized)  Other lack of coordination  Other disturbances of skin sensation  Rationale for Evaluation and Treatment: Rehabilitation  SUBJECTIVE:   SUBJECTIVE STATEMENT: I need more pain meds Pt accompanied by: self  PERTINENT HISTORY: DM, HTN  PRECAUTIONS: Other: per protocol  RED FLAGS: None   WEIGHT BEARING RESTRICTIONS:non wt bearing through Lt hand  PAIN:  Are you having pain? Yes: NPRS scale: 7-9/10 Pain location: Lt wrist and forearm to elbow Pain description: achy, sore Aggravating factors: movement Relieving factors: ice, elevation  FALLS: Has patient fallen in last 6 months? No  LIVING ENVIRONMENT: Lives with: lives with their family  PLOF: Independent and in between jobs  PATIENT GOALS: Get my hand better  NEXT MD VISIT: 08/31/23  OBJECTIVE:  Note: Objective measures were completed at Evaluation unless otherwise noted.  HAND DOMINANCE:  Right  ADLs: Overall ADLs: mod I for BADLS, aunt doing IADLS  FUNCTIONAL OUTCOME MEASURES: Quick Dash: 45.5% deficit  UPPER EXTREMITY ROM:   BUE AROM at shoulders and elbows WNL's   Lt thumb intact. Unable to assess Lt wrist and and MP's due to precautions. Finger PIP joints WFL's - limited to approx 90* for splint    HAND FUNCTION: Did not assess grip or pinch strength d/t current precautions  COORDINATION: Not assessed d/t current precautions  SENSATION: Reports intact now  EDEMA: mild dorsal hand and fingers  COGNITION: Overall cognitive status: Within functional limits for tasks assessed  OBSERVATIONS: Pt arrived fully wrapped/protected Lt hand/wrist (with IP's free) from surgery    TODAY'S TREATMENT:                                                                                                                              DATE: 08/25/23  Post surgical dressings/soft cast gently removed and  hand/forearm cleaned with wet washcloth, avoiding stitches, and fully dried prior to splint fabrication - pt maintained same position t/o hygiene care which included wrist in 10-15* extension and MP's straight.   Fabricated and fitted volar forearm based splint for LUE to keep wrist in approx 15* wrist extension and MP's in full extension per protocol. Kept IP's free as pt arrived with IP's free. Reviewed splint wear and care and issued splint.   Reviewed current precautions with patient and he verbalized understanding and appeared to follow instructions well. Pt also instructed to keep shoulder, elbow, forearm, thumb, and IP's moving in splint - pt return demo   PATIENT EDUCATION: Education details: see above Person educated: Patient Education method: Programmer, multimedia, Demonstration, Verbal cues, and Handouts Education comprehension: verbalized understanding  HOME EXERCISE PROGRAM: 08/25/23: Splint wear and care, hygiene care, current precautions  GOALS: Goals reviewed with  patient? Yes  SHORT TERM GOALS: Target date: 09/25/23  Independent with splint wear and care Baseline: issued, may need adjustments Goal status: INITIAL  2.  Pt to verbalize understanding with scar massage, edema management techniques Baseline: not yet addressed Goal status: INITIAL  3.  Pt to be independent with initial A/ROM HEP  Baseline: not yet addressed for wrist and MP's d/t current precautions Goal status: INITIAL  4.  Pt to report pain less than or equal to 5/10 with initial A/ROM  Baseline: 7-9/10 Goal status: INITIAL   LONG TERM GOALS: Target date: 10/26/23  Independent with updated strengthening HEP  Baseline: Dependent d/t current precautions Goal status: INITIAL  2.  Pt to demo wrist and MP ROM LUE WFL's for all functional tasks Baseline: unable to assess d/t current precautions Goal status: INITIAL  3.  Pt to perform grip strength Lt hand that is 50% or more compared to Rt dominant hand Baseline: unable to assess d/t current precautions Goal status: INITIAL  4.  Pt to return to using Lt hand as assist for all bilateral tasks Baseline: unable d/t current precautions Goal status: INITIAL  5.  Quick Dash to improve to 30% deficit or less Baseline: 45.5% Goal status: INITIAL   ASSESSMENT:  CLINICAL IMPRESSION: Patient is a 27 y.o. male who was seen today for occupational therapy evaluation for splinting s/p extensor tendon repairs (zone VII) to EDC, EIP, and EDQ of LUE on 08/18/23. Hx includes DM, HTN. Patient currently presents below baseline level of functioning demonstrating functional deficits and impairments as noted below. Pt would benefit from skilled OT services in the outpatient setting to work on impairments as noted below to help pt return to PLOF as able.   Marland Kitchen   PERFORMANCE DEFICITS: in functional skills including ADLs, IADLs, coordination, dexterity, edema, ROM, strength, pain, Fine motor control, decreased knowledge of precautions, skin integrity,  and UE functional use,.   IMPAIRMENTS: are limiting patient from ADLs, IADLs, work, and social participation.   COMORBIDITIES: may have co-morbidities  that affects occupational performance. Patient will benefit from skilled OT to address above impairments and improve overall function.  MODIFICATION OR ASSISTANCE TO COMPLETE EVALUATION: No modification of tasks or assist necessary to complete an evaluation.  OT OCCUPATIONAL PROFILE AND HISTORY: Problem focused assessment: Including review of records relating to presenting problem.  CLINICAL DECISION MAKING: Moderate - several treatment options, min-mod task modification necessary  REHAB POTENTIAL: Good  EVALUATION COMPLEXITY: Low      PLAN:  OT FREQUENCY: 1x/week  OT DURATION: 2 weeks, followed by 2x/wk for 4 weeks  PLANNED INTERVENTIONS: 97535 self care/ADL  training, 98119 therapeutic exercise, 97530 therapeutic activity, 97140 manual therapy, 97035 ultrasound, 97018 paraffin, 14782 fluidotherapy, 97010 moist heat, 97034 contrast bath, 97014 electrical stimulation unattended, 97760 Orthotics management and training, 95621 Splinting (initial encounter), scar mobilization, passive range of motion, compression bandaging, patient/family education, and DME and/or AE instructions  RECOMMENDED OTHER SERVICES: none at this time  CONSULTED AND AGREED WITH PLAN OF CARE: Patient  PLAN FOR NEXT SESSION: splint check and adjustments prn, edema control prn, assess incision - if healed instruct in scar massage as able  For all possible CPT codes, reference the Planned Interventions line above.     Check all conditions that are expected to impact treatment: {Conditions expected to impact treatment:None of these apply   If treatment provided at initial evaluation, no treatment charged due to lack of authorization.        Sheran Lawless, OT 08/25/2023, 9:21 AM

## 2023-08-25 ENCOUNTER — Encounter: Payer: Self-pay | Admitting: Occupational Therapy

## 2023-08-25 ENCOUNTER — Ambulatory Visit: Payer: Medicaid Other | Attending: Orthopedic Surgery | Admitting: Occupational Therapy

## 2023-08-25 DIAGNOSIS — M6281 Muscle weakness (generalized): Secondary | ICD-10-CM | POA: Diagnosis present

## 2023-08-25 DIAGNOSIS — R278 Other lack of coordination: Secondary | ICD-10-CM | POA: Insufficient documentation

## 2023-08-25 DIAGNOSIS — R208 Other disturbances of skin sensation: Secondary | ICD-10-CM | POA: Diagnosis present

## 2023-08-25 DIAGNOSIS — M25632 Stiffness of left wrist, not elsewhere classified: Secondary | ICD-10-CM | POA: Insufficient documentation

## 2023-08-25 DIAGNOSIS — M25532 Pain in left wrist: Secondary | ICD-10-CM | POA: Diagnosis present

## 2023-08-25 NOTE — Patient Instructions (Signed)
SPLINT WEAR AND CARE:   WEARING SCHEDULE:  Wear splint at ALL times except for hygiene care - keep wrist and fingers immobilized in same position when removing to clean hand/forearm and splint   PURPOSE:  To prevent movement and for protection until injury can heal  CARE OF SPLINT:  Keep splint away from heat sources including: stove, radiator or furnace, or a car in sunlight. The splint can melt and will no longer fit you properly  Keep away from pets and children  Clean the splint with rubbing alcohol 1-2 times per day.  * During this time, make sure you also clean your hand/arm as instructed by your therapist and/or perform dressing changes as needed. Then dry hand/arm completely before replacing splint. (When cleaning hand/arm, keep it immobilized in same position until splint is replaced)  PRECAUTIONS/POTENTIAL PROBLEMS: *If you notice or experience increased pain, swelling, numbness, or a lingering reddened area from the splint: Contact your therapist immediately by calling [phone]. You must wear the splint for protection, but we will get you scheduled for adjustments as quickly as possible.  (If only straps or hooks need to be replaced and NO adjustments to the splint need to be made, just call the office ahead and let them know you are coming in)  If you have any medical concerns or signs of infection, please call your doctor immediately

## 2023-08-31 ENCOUNTER — Ambulatory Visit: Payer: Medicaid Other | Admitting: Occupational Therapy

## 2023-08-31 ENCOUNTER — Encounter: Payer: Self-pay | Admitting: Occupational Therapy

## 2023-08-31 DIAGNOSIS — R208 Other disturbances of skin sensation: Secondary | ICD-10-CM

## 2023-08-31 DIAGNOSIS — M25532 Pain in left wrist: Secondary | ICD-10-CM | POA: Diagnosis not present

## 2023-08-31 DIAGNOSIS — M6281 Muscle weakness (generalized): Secondary | ICD-10-CM

## 2023-08-31 DIAGNOSIS — R278 Other lack of coordination: Secondary | ICD-10-CM

## 2023-08-31 DIAGNOSIS — M25632 Stiffness of left wrist, not elsewhere classified: Secondary | ICD-10-CM

## 2023-08-31 NOTE — Therapy (Signed)
OUTPATIENT OCCUPATIONAL THERAPY ORTHO TREATMENT  Patient Name: Chase Jones MRN: [id number] DOB:Jan 10, 1996, 27 y.o., male Today's Date: 08/31/2023  PCP: none REFERRING PROVIDER: Mack Hook, MD  END OF SESSION:  OT End of Session - 08/31/23 0849     Visit Number 2    Number of Visits 12    Date for OT Re-Evaluation 10/26/23    Authorization Type [employer] - approved 10 visits    Authorization Time Period 08/31/23 - 10/30/23    Authorization - Visit Number 1    Authorization - Number of Visits 10    OT Start Time 0848    OT Stop Time 0908    OT Time Calculation (min) 20 min    Activity Tolerance Patient tolerated treatment well    Behavior During Therapy WFL for tasks assessed/performed             Past Medical History:  Diagnosis Date   Diabetes mellitus without complication (HCC)    Hypertension    History reviewed. No pertinent surgical history. There are no problems to display for this patient.   ONSET DATE: 08/18/2023  REFERRING DIAG: Lt wrist EDC (4), and EIP, EDQ  repair -DOS 08/18/23  THERAPY DIAG:  Pain in left wrist  Stiffness of left wrist, not elsewhere classified  Muscle weakness (generalized)  Other disturbances of skin sensation  Other lack of coordination  Rationale for Evaluation and Treatment: Rehabilitation  SUBJECTIVE:   SUBJECTIVE STATEMENT: I see Dr. Janee Morn today at 3:00. The splint is fitting good Pt accompanied by: self  PERTINENT HISTORY: DM, HTN  PRECAUTIONS: Other: per protocol  RED FLAGS: None   WEIGHT BEARING RESTRICTIONS:non wt bearing through Lt hand  PAIN:  Are you having pain? Yes: NPRS scale: 4/10 Pain location: Lt wrist and forearm to elbow Pain description: achy, sore Aggravating factors: movement Relieving factors: ice, elevation  FALLS: Has patient fallen in last 6 months? No  LIVING ENVIRONMENT: Lives with: lives with their family  PLOF: Independent and in between jobs  PATIENT  GOALS: Get my hand better  NEXT MD VISIT: 08/31/23  OBJECTIVE:  Note: Objective measures were completed at Evaluation unless otherwise noted.  HAND DOMINANCE: Right  ADLs: Overall ADLs: mod I for BADLS, aunt doing IADLS  FUNCTIONAL OUTCOME MEASURES: Quick Dash: 45.5% deficit  UPPER EXTREMITY ROM:   BUE AROM at shoulders and elbows WNL's   Lt thumb intact. Unable to assess Lt wrist and and MP's due to precautions. Finger PIP joints WFL's - limited to approx 90* for splint    HAND FUNCTION: Did not assess grip or pinch strength d/t current precautions  COORDINATION: Not assessed d/t current precautions  SENSATION: Reports intact now  EDEMA: mild dorsal hand and fingers  COGNITION: Overall cognitive status: Within functional limits for tasks assessed  OBSERVATIONS: Pt arrived fully wrapped/protected Lt hand/wrist (with IP's free) from surgery    TODAY'S TREATMENT:  DATE: 08/31/23  Pt now almost 2 weeks post-op. Splint continues to fit well.  Pt issued new straps, extra stockinette, and more compressive stockinette today (tensogrip) to help manage mild remaining edema and for comfort.  Therapist removed current stockinette to assess incision - healing well w/ no signs of infection. Pt shown scar massage, however adapted to not do directly over center horizontal incision d/t remaining stitches. Pt instructed to do close to scar but not directly over until stitches fully dissolved. Pt will also benefit from pulsed [place] next week.  Reviewed precautions and splint wear and care with patient.    PATIENT EDUCATION: Education details: see above Person educated: Patient Education method: Programmer, multimedia, Demonstration, Verbal cues, and Handouts Education comprehension: verbalized understanding  HOME EXERCISE PROGRAM: 08/25/23: Splint wear and care, hygiene  care, current precautions  GOALS: Goals reviewed with patient? Yes  SHORT TERM GOALS: Target date: 09/25/23  Independent with splint wear and care Baseline: issued, may need adjustments Goal status: INITIAL  2.  Pt to verbalize understanding with scar massage, edema management techniques Baseline: not yet addressed Goal status: INITIAL  3.  Pt to be independent with initial A/ROM HEP  Baseline: not yet addressed for wrist and MP's d/t current precautions Goal status: INITIAL  4.  Pt to report pain less than or equal to 5/10 with initial A/ROM  Baseline: 7-9/10 Goal status: INITIAL   LONG TERM GOALS: Target date: 10/26/23  Independent with updated strengthening HEP  Baseline: Dependent d/t current precautions Goal status: INITIAL  2.  Pt to demo wrist and MP ROM LUE WFL's for all functional tasks Baseline: unable to assess d/t current precautions Goal status: INITIAL  3.  Pt to perform grip strength Lt hand that is 50% or more compared to Rt dominant hand Baseline: unable to assess d/t current precautions Goal status: INITIAL  4.  Pt to return to using Lt hand as assist for all bilateral tasks Baseline: unable d/t current precautions Goal status: INITIAL  5.  Quick Dash to improve to 30% deficit or less Baseline: 45.5% Goal status: INITIAL   ASSESSMENT:  CLINICAL IMPRESSION: Patient is a 27 y.o. male who was seen today for occupational therapy evaluation for splinting s/p extensor tendon repairs (zone VII) to EDC, EIP, and EDQ of LUE on 08/18/23. Hx includes DM, HTN. Patient currently presents below baseline level of functioning demonstrating functional deficits and impairments as noted below. Pt would benefit from skilled OT services in the outpatient setting to work on impairments as noted below to help pt return to PLOF as able.   Marland Kitchen   PERFORMANCE DEFICITS: in functional skills including ADLs, IADLs, coordination, dexterity, edema, ROM, strength, pain, Fine motor  control, decreased knowledge of precautions, skin integrity, and UE functional use,.   IMPAIRMENTS: are limiting patient from ADLs, IADLs, work, and social participation.   COMORBIDITIES: may have co-morbidities  that affects occupational performance. Patient will benefit from skilled OT to address above impairments and improve overall function.  MODIFICATION OR ASSISTANCE TO COMPLETE EVALUATION: No modification of tasks or assist necessary to complete an evaluation.  OT OCCUPATIONAL PROFILE AND HISTORY: Problem focused assessment: Including review of records relating to presenting problem.  CLINICAL DECISION MAKING: Moderate - several treatment options, min-mod task modification necessary  REHAB POTENTIAL: Good  EVALUATION COMPLEXITY: Low      PLAN:  OT FREQUENCY: 1x/week  OT DURATION: 2 weeks, followed by 2x/wk for 4 weeks  PLANNED INTERVENTIONS: 97535 self care/ADL training, 56213 therapeutic exercise, 97530  therapeutic activity, 97140 manual therapy, 97035 ultrasound, 84696 paraffin, 97039 fluidotherapy, 97010 moist heat, 97034 contrast bath, 97014 electrical stimulation unattended, 97760 Orthotics management and training, 97760 Splinting (initial encounter), scar mobilization, passive range of motion, compression bandaging, patient/family education, and DME and/or AE instructions  RECOMMENDED OTHER SERVICES: none at this time  CONSULTED AND AGREED WITH PLAN OF CARE: Patient  PLAN FOR NEXT SESSION: scar massage, pulsed [place],  gentle mid-range active motion to wrist w/ digits in extension at 3 weeks post-op per protocol  For all possible CPT codes, reference the Planned Interventions line above.     Check all conditions that are expected to impact treatment: {Conditions expected to impact treatment:None of these apply   If treatment provided at initial evaluation, no treatment charged due to lack of authorization.        Sheran Lawless, OT 08/31/2023, 9:08 AM

## 2023-09-07 ENCOUNTER — Ambulatory Visit: Payer: Medicaid Other | Admitting: Occupational Therapy

## 2023-09-07 ENCOUNTER — Encounter: Payer: Self-pay | Admitting: Occupational Therapy

## 2023-09-07 DIAGNOSIS — M25532 Pain in left wrist: Secondary | ICD-10-CM

## 2023-09-07 DIAGNOSIS — M6281 Muscle weakness (generalized): Secondary | ICD-10-CM

## 2023-09-07 DIAGNOSIS — R208 Other disturbances of skin sensation: Secondary | ICD-10-CM

## 2023-09-07 DIAGNOSIS — M25632 Stiffness of left wrist, not elsewhere classified: Secondary | ICD-10-CM

## 2023-09-07 NOTE — Therapy (Signed)
OUTPATIENT OCCUPATIONAL THERAPY ORTHO TREATMENT  Patient Name: Chase Jones MRN: [id number] DOB:October 29, 1995, 27 y.o., male Today's Date: 09/07/2023  PCP: none REFERRING PROVIDER: Mack Hook, MD  END OF SESSION:  OT End of Session - 09/07/23 0851     Visit Number 3    Number of Visits 12    Date for OT Re-Evaluation 10/26/23    Authorization Type [employer] - approved 10 visits    Authorization Time Period 08/31/23 - 10/30/23    Authorization - Number of Visits 10    OT Start Time 0848    OT Stop Time 0930    OT Time Calculation (min) 42 min    Activity Tolerance Patient tolerated treatment well    Behavior During Therapy WFL for tasks assessed/performed             Past Medical History:  Diagnosis Date   Diabetes mellitus without complication (HCC)    Hypertension    History reviewed. No pertinent surgical history. There are no active problems to display for this patient.   ONSET DATE: 08/18/2023  REFERRING DIAG: Lt wrist EDC (4), and EIP, EDQ  repair -DOS 08/18/23  THERAPY DIAG:  Pain in left wrist  Stiffness of left wrist, not elsewhere classified  Other disturbances of skin sensation  Muscle weakness (generalized)  Rationale for Evaluation and Treatment: Rehabilitation  SUBJECTIVE:   SUBJECTIVE STATEMENT: No pain today. I did slightly hurt this arm using as a stabilizer w/ splint on but MD looked at it and said it was fine.  Pt accompanied by: self  PERTINENT HISTORY: DM, HTN  PRECAUTIONS: Other: per protocol  RED FLAGS: None   WEIGHT BEARING RESTRICTIONS:non wt bearing through Lt hand  PAIN:  Are you having pain? Yes: NPRS scale: 4/10 Pain location: Lt wrist and forearm to elbow Pain description: achy, sore Aggravating factors: movement Relieving factors: ice, elevation  FALLS: Has patient fallen in last 6 months? No  LIVING ENVIRONMENT: Lives with: lives with their family  PLOF: Independent and in between jobs  PATIENT  GOALS: Get my hand better  NEXT MD VISIT: 08/31/23  OBJECTIVE:  Note: Objective measures were completed at Evaluation unless otherwise noted.  HAND DOMINANCE: Right  ADLs: Overall ADLs: mod I for BADLS, aunt doing IADLS  FUNCTIONAL OUTCOME MEASURES: Quick Dash: 45.5% deficit  UPPER EXTREMITY ROM:   BUE AROM at shoulders and elbows WNL's   Lt thumb intact. Unable to assess Lt wrist and and MP's due to precautions. Finger PIP joints WFL's - limited to approx 90* for splint    HAND FUNCTION: Did not assess grip or pinch strength d/t current precautions  COORDINATION: Not assessed d/t current precautions  SENSATION: Reports intact now  EDEMA: mild dorsal hand and fingers  COGNITION: Overall cognitive status: Within functional limits for tasks assessed  OBSERVATIONS: Pt arrived fully wrapped/protected Lt hand/wrist (with IP's free) from surgery    TODAY'S TREATMENT:  DATE: 09/07/23  Pt now 3 weeks post-op.  Adjusted splint for better fit due to decreased swelling including: cutting down slightly for greater PIP flexion, cutting down on index finger side at proximal phalanx, adding padding across top of proximal phalanges and changing angle of distal strap.   Reviewed scar massage and instructed he can now do fully over incision d/t healing. Therapist demo  Pulsed ultrasound over incision dorsal Lt wrist for scar management at 20%, 3 Mhz, 0.8 wts/cm2 x 8 min.   Pt shown ex's to perform per 3 week postop protocol including:  - removing distal finger strap and performing active extension of digits from resting position in orthosis -performing gentle, mid range active wrist flex/ext outside of splint while keeping digits in extension (supported by other hand). Pt cautioned not to push on wrist passively while supporting digits w/ other hand Pt returned  demo of both above exercises. Pt instructed to perform 15-25 reps, 4 -6 times per day per protocol.    PATIENT EDUCATION: Education details: initial 3 week post op protocol Person educated: Patient Education method: Programmer, multimedia, Demonstration, Verbal cues, and Handouts Education comprehension: verbalized understanding  HOME EXERCISE PROGRAM: 08/25/23: Splint wear and care, hygiene care, current precautions 09/07/23: initial 3 week post op protocol  GOALS: Goals reviewed with patient? Yes  SHORT TERM GOALS: Target date: 09/25/23  Independent with splint wear and care Baseline: issued, may need adjustments Goal status: MET  2.  Pt to verbalize understanding with scar massage, edema management techniques Baseline: not yet addressed Goal status: MET  3.  Pt to be independent with initial A/ROM HEP  Baseline: not yet addressed for wrist and MP's d/t current precautions Goal status: IN PROGRESS  4.  Pt to report pain less than or equal to 5/10 with initial A/ROM  Baseline: 7-9/10 Goal status: INITIAL   LONG TERM GOALS: Target date: 10/26/23  Independent with updated strengthening HEP  Baseline: Dependent d/t current precautions Goal status: INITIAL  2.  Pt to demo wrist and MP ROM LUE WFL's for all functional tasks Baseline: unable to assess d/t current precautions Goal status: INITIAL  3.  Pt to perform grip strength Lt hand that is 50% or more compared to Rt dominant hand Baseline: unable to assess d/t current precautions Goal status: INITIAL  4.  Pt to return to using Lt hand as assist for all bilateral tasks Baseline: unable d/t current precautions Goal status: INITIAL  5.  Quick Dash to improve to 30% deficit or less Baseline: 45.5% Goal status: INITIAL   ASSESSMENT:  CLINICAL IMPRESSION: Patient is a 27 y.o. male who was seen today for occupational therapy treatment. Pt has met 2 STG's and progressing per protocol without concerns. Pt would benefit from  continued skilled OT services in the outpatient setting to work on impairments as noted below to help pt return to PLOF as able.   Marland Kitchen   PERFORMANCE DEFICITS: in functional skills including ADLs, IADLs, coordination, dexterity, edema, ROM, strength, pain, Fine motor control, decreased knowledge of precautions, skin integrity, and UE functional use,.   IMPAIRMENTS: are limiting patient from ADLs, IADLs, work, and social participation.   COMORBIDITIES: may have co-morbidities  that affects occupational performance. Patient will benefit from skilled OT to address above impairments and improve overall function.  MODIFICATION OR ASSISTANCE TO COMPLETE EVALUATION: No modification of tasks or assist necessary to complete an evaluation.  OT OCCUPATIONAL PROFILE AND HISTORY: Problem focused assessment: Including review of records relating to presenting problem.  CLINICAL DECISION MAKING: Moderate - several treatment options, min-mod task modification necessary  REHAB POTENTIAL: Good  EVALUATION COMPLEXITY: Low      PLAN:  OT FREQUENCY: 1x/week  OT DURATION: 2 weeks, followed by 2x/wk for 4 weeks  PLANNED INTERVENTIONS: 97535 self care/ADL training, 97110 therapeutic exercise, 97530 therapeutic activity, 97140 manual therapy, 97035 ultrasound, 97018 paraffin, 97039 fluidotherapy, 97010 moist heat, 97034 contrast bath, 97014 electrical stimulation unattended, 97760 Orthotics management and training, 97760 Splinting (initial encounter), scar mobilization, passive range of motion, compression bandaging, patient/family education, and DME and/or AE instructions  RECOMMENDED OTHER SERVICES: none at this time  CONSULTED AND AGREED WITH PLAN OF CARE: Patient  PLAN FOR NEXT SESSION: continue pulsed [place], begin 4 week post-op protocol on 09/14/23.     For all possible CPT codes, reference the Planned Interventions line above.     Check all conditions that are expected to impact treatment:  {Conditions expected to impact treatment:None of these apply   If treatment provided at initial evaluation, no treatment charged due to lack of authorization.        Sheran Lawless, OT 09/07/2023, 8:51 AM

## 2023-09-14 ENCOUNTER — Ambulatory Visit: Payer: Medicaid Other | Admitting: Occupational Therapy

## 2023-09-14 DIAGNOSIS — M25632 Stiffness of left wrist, not elsewhere classified: Secondary | ICD-10-CM

## 2023-09-14 DIAGNOSIS — M6281 Muscle weakness (generalized): Secondary | ICD-10-CM

## 2023-09-14 DIAGNOSIS — M25532 Pain in left wrist: Secondary | ICD-10-CM

## 2023-09-14 DIAGNOSIS — R208 Other disturbances of skin sensation: Secondary | ICD-10-CM

## 2023-09-14 NOTE — Therapy (Signed)
OUTPATIENT OCCUPATIONAL THERAPY ORTHO TREATMENT  Patient Name: Chase Jones MRN: [id number] DOB:Mar 10, 1996, 27 y.o., male Today's Date: 09/14/2023  PCP: none REFERRING PROVIDER: Mack Hook, MD  END OF SESSION:  OT End of Session - 09/14/23 1021     Visit Number 4    Number of Visits 12    Date for OT Re-Evaluation 10/26/23    Authorization Type [employer] - approved 10 visits    Authorization Time Period 08/31/23 - 10/30/23    Authorization - Number of Visits 10    OT Start Time 0934    OT Stop Time 1013    OT Time Calculation (min) 39 min    Activity Tolerance Patient tolerated treatment well    Behavior During Therapy WFL for tasks assessed/performed              Past Medical History:  Diagnosis Date   Diabetes mellitus without complication (HCC)    Hypertension    No past surgical history on file. There are no active problems to display for this patient.   ONSET DATE: 08/18/2023  REFERRING DIAG: Lt wrist EDC (4), and EIP, EDQ  repair -DOS 08/18/23  THERAPY DIAG:  Pain in left wrist  Stiffness of left wrist, not elsewhere classified  Other disturbances of skin sensation  Muscle weakness (generalized)  Rationale for Evaluation and Treatment: Rehabilitation  SUBJECTIVE:   SUBJECTIVE STATEMENT: Pt reported completing exercises in splint and wrist exercises out of splint. Pt reported the hand "feels better each time."  Pt reported ultrasound "made it feel a lot better honestly."  Pt accompanied by: self  PERTINENT HISTORY: DM, HTN  PRECAUTIONS: Other: per protocol  RED FLAGS: None   WEIGHT BEARING RESTRICTIONS:non wt bearing through Lt hand  PAIN:  Are you having pain? No pain today. Per pt "pain is getting better and better every day."   FALLS: Has patient fallen in last 6 months? No  LIVING ENVIRONMENT: Lives with: lives with their family  PLOF: Independent and in between jobs  PATIENT GOALS: Get my hand better  NEXT MD  VISIT: 08/31/23  OBJECTIVE:  Note: Objective measures were completed at Evaluation unless otherwise noted.  HAND DOMINANCE: Right  ADLs: Overall ADLs: mod I for BADLS, aunt doing IADLS  FUNCTIONAL OUTCOME MEASURES: Quick Dash: 45.5% deficit  UPPER EXTREMITY ROM:   BUE AROM at shoulders and elbows WNL's   Lt thumb intact. Unable to assess Lt wrist and and MP's due to precautions. Finger PIP joints WFL's - limited to approx 90* for splint    HAND FUNCTION: Did not assess grip or pinch strength d/t current precautions  COORDINATION: Not assessed d/t current precautions  SENSATION: Reports intact now  EDEMA: mild dorsal hand and fingers  COGNITION: Overall cognitive status: Within functional limits for tasks assessed  OBSERVATIONS: Pt arrived fully wrapped/protected Lt hand/wrist (with IP's free) from surgery    TODAY'S TREATMENT:  DATE: 09/14/23  Pt now 4 weeks post-op.   Manual: OT completed STM scar massage of affected UE over full scar - circles, zig-zag, parallel, and perpendicular scar massage - to decrease scar tightness, to promote scar tissue remodeling. OT noted that scar appeared to be healing well with increased tightness of scar at medial aspect compared to lateral aspect of scar. Pt returned demonstration of scar massage.   OT educated pt on desensitization/re-sensitization and strategy of using washcloth/dish towel over scar site to improve desensitization/re-sensitization. OT completed scar massage using washcloth to improve desensitization/re-sensitization. Pt returned demonstration.   Ultrasound: Pulsed ultrasound over incision dorsal Lt wrist for scar management at 20%, 3.3 Mhz, 0.8 wts/cm2 x 6 min.   TherEx: Pt shown ex's to perform per 3 and 4 week postop protocol. OT reviewed 3 week exercises and pt returned demonstration: -  removing distal finger strap and performing active extension of digits from resting position in orthosis -performing gentle, mid range active wrist flex/ext outside of splint while keeping digits in extension (supported by other hand). Pt cautioned not to push on wrist passively while supporting digits w/ other hand  OT educated pt on some 4 week exercises per protocol as tolerated. Handout provided, see pt instructions.  Of note, pt demo'd increased difficulty extending LUE digits 3-4 compared to digits 2 and 5. Extension of digits 3-4 improved with repeated reps of digit composite extension. Per pt, pt demo'd increased difficulty with digit 3-4 ext after attempting to stabilize a jar with LUE when opening jar with RUE. Pt went to doctor and per pt's report, no additional injury occurred. OT educated pt on UE anatomy, joint protection, protocol, precautions, and being mindful to avoid strengthening of affected UE during day-to-day tasks (e.g. stabilizing jar, pulling in a chair, holding larger objects, etc.). Pt verbalized understanding of all.    PATIENT EDUCATION: Education details: initial 4 week post op protocol Person educated: Patient Education method: Programmer, multimedia, Demonstration, Verbal cues, and Handouts Education comprehension: verbalized understanding  HOME EXERCISE PROGRAM: 08/25/23: Splint wear and care, hygiene care, current precautions 09/07/23: initial 3 week post op protocol 09/14/23: initial 4 week post op protocol  GOALS: Goals reviewed with patient? Yes  SHORT TERM GOALS: Target date: 09/25/23  Independent with splint wear and care Baseline: issued, may need adjustments Goal status: MET  2.  Pt to verbalize understanding with scar massage, edema management techniques Baseline: not yet addressed Goal status: MET  3.  Pt to be independent with initial A/ROM HEP  Baseline: not yet addressed for wrist and MP's d/t current precautions Goal status: IN PROGRESS  4.  Pt to  report pain less than or equal to 5/10 with initial A/ROM  Baseline: 7-9/10 Goal status: INITIAL   LONG TERM GOALS: Target date: 10/26/23  Independent with updated strengthening HEP  Baseline: Dependent d/t current precautions Goal status: INITIAL  2.  Pt to demo wrist and MP ROM LUE WFL's for all functional tasks Baseline: unable to assess d/t current precautions Goal status: INITIAL  3.  Pt to perform grip strength Lt hand that is 50% or more compared to Rt dominant hand Baseline: unable to assess d/t current precautions Goal status: INITIAL  4.  Pt to return to using Lt hand as assist for all bilateral tasks Baseline: unable d/t current precautions Goal status: INITIAL  5.  Quick Dash to improve to 30% deficit or less Baseline: 45.5% Goal status: INITIAL   ASSESSMENT:  CLINICAL IMPRESSION: Pt tolerated tasks well  and demo'd good carryover of education from previous sessions. OT to continue to progress exercises per protocol. Pt would benefit from continued skilled OT services in the outpatient setting to work on impairments as noted below to help pt return to PLOF as able.    PERFORMANCE DEFICITS: in functional skills including ADLs, IADLs, coordination, dexterity, edema, ROM, strength, pain, Fine motor control, decreased knowledge of precautions, skin integrity, and UE functional use,.   IMPAIRMENTS: are limiting patient from ADLs, IADLs, work, and social participation.   COMORBIDITIES: may have co-morbidities  that affects occupational performance. Patient will benefit from skilled OT to address above impairments and improve overall function.  MODIFICATION OR ASSISTANCE TO COMPLETE EVALUATION: No modification of tasks or assist necessary to complete an evaluation.  OT OCCUPATIONAL PROFILE AND HISTORY: Problem focused assessment: Including review of records relating to presenting problem.  CLINICAL DECISION MAKING: Moderate - several treatment options, min-mod task  modification necessary  REHAB POTENTIAL: Good  EVALUATION COMPLEXITY: Low      PLAN:  OT FREQUENCY: 1x/week  OT DURATION: 2 weeks, followed by 2x/wk for 4 weeks  PLANNED INTERVENTIONS: 97535 self care/ADL training, 57846 therapeutic exercise, 97530 therapeutic activity, 97140 manual therapy, 97035 ultrasound, 97018 paraffin, 96295 fluidotherapy, 97010 moist heat, 97034 contrast bath, 97014 electrical stimulation unattended, 97760 Orthotics management and training, 28413 Splinting (initial encounter), scar mobilization, passive range of motion, compression bandaging, patient/family education, and DME and/or AE instructions  RECOMMENDED OTHER SERVICES: none at this time  CONSULTED AND AGREED WITH PLAN OF CARE: Patient  PLAN FOR NEXT SESSION: continue pulsed [place], continue 4 week post-op protocol, Dycem for scar mobilization    For all possible CPT codes, reference the Planned Interventions line above.     Check all conditions that are expected to impact treatment: {Conditions expected to impact treatment:None of these apply   If treatment provided at initial evaluation, no treatment charged due to lack of authorization.        Wynetta Emery, OT 09/14/2023, 10:42 AM

## 2023-09-14 NOTE — Patient Instructions (Signed)
Complete exercises 5-6 times per day, 15-25 reps  Week 3 review: Remove "far" (distal) strap and actively straighten fingers from resting position in orthosis Remove straps and (gentle, mid-range) actively move wrist back (use right hand for support - avoid pushing on wrist passively with right hand)  Week 4: Keep fingers straight - move wrist actively forward and back. Support with right hand (avoid pushing on wrist passively). Stop arc of range of motion if you feel twinge/pull. Keep fingers relaxed, flex/extend the "big knuckles" (MCP joints) - Stop arc of range of motion if you feel twinge/pull. Start with 10 reps and increase to 20 as tolerated.  For desensitization - rub scar with hand towel or washcloth

## 2023-09-17 ENCOUNTER — Ambulatory Visit: Payer: Medicaid Other | Admitting: Occupational Therapy

## 2023-09-21 ENCOUNTER — Telehealth: Payer: Self-pay | Admitting: Occupational Therapy

## 2023-09-21 ENCOUNTER — Ambulatory Visit: Payer: Medicaid Other | Admitting: Occupational Therapy

## 2023-09-21 NOTE — Telephone Encounter (Signed)
Attempted to call patient's number x 2 re: 2 missed O.T. appointments but was unavailable - ? Wrong/invalid number. Unable to leave message.  If patient no shows again, will remove all but 1 remaining appointment

## 2023-09-23 ENCOUNTER — Ambulatory Visit: Payer: Medicaid Other | Attending: Orthopedic Surgery | Admitting: Occupational Therapy

## 2023-09-23 DIAGNOSIS — M25632 Stiffness of left wrist, not elsewhere classified: Secondary | ICD-10-CM | POA: Diagnosis present

## 2023-09-23 DIAGNOSIS — R278 Other lack of coordination: Secondary | ICD-10-CM | POA: Insufficient documentation

## 2023-09-23 DIAGNOSIS — R208 Other disturbances of skin sensation: Secondary | ICD-10-CM | POA: Diagnosis present

## 2023-09-23 DIAGNOSIS — M25532 Pain in left wrist: Secondary | ICD-10-CM | POA: Diagnosis present

## 2023-09-23 DIAGNOSIS — M6281 Muscle weakness (generalized): Secondary | ICD-10-CM | POA: Insufficient documentation

## 2023-09-23 NOTE — Patient Instructions (Addendum)
 Home Exercise Program  Scar massage To help remodel scar - Once scar is fully closed, rub the surface of the scar using your opposite hand with moderate pressure - clockwise, counterclockwise, zig-zags across the scar, perpendicular to scar. For desensitization - rub scar with hand towel or washcloth  Week 3 Continue to wear your splint between exercises and at night Avoid pain with all movements, slight stretch is expected Complete exercises 5-6 times per day, 15-25 reps  Finger extension: In extension orthosis: Remove far (distal) strap around fingers and actively straighten fingers from resting position in orthosis  Wrist extension: Remove straps of orthosis. Keep fingers straight. Actively move wrist back to extend wrist (gentle, mid-range motion). Rest the side of your hand on tabletop for support as needed.   Week 4 Continue to wear your splint between exercises and at night Avoid pain with all movements, slight stretch is expected Complete exercises 5-6 times per day, 15-25 reps  Wrist extension/flexion: Keep fingers straight - move wrist actively forward and back. Rest the side of your hand on tabletop for support as needed.  Wrist ulnar/radial deviation: Keep fingers straight - move wrist actively side to side. Support with palm flat on tabletop as needed.  Wrist circumduction: Keep fingers straight - position fingers facing towards the sky with elbow on table ("arm wrestle position") then move wrist actively in small circles - clockwise then counterclockwise. Pretend to write an "O" with middle finger by moving the wrist.  MCP flexion "bear claw" or "hook fist" position: Keep fingers bent, actively flex/extend the big knuckles (MCP joints). You may [place] wrap your fingers into bent position as needed. Rest the side of your hand on tabletop for support as needed.  MCP flexion "tabletop" position: Keep fingers straight, actively flex/extend the "big knuckles" (MCP joints).  Rest the side of your hand on tabletop for support as needed.  Isolated index finger and pinky finger extension: Place palm flat on tabletop. Lift only the index (pointer) finger. Then lift only the pinky finger. Repeat.  Composite active flexion/extension of fingers - Emphasis on "tenodesis" movement pattern: Move wrist back and fingers will naturally bend, move wrist forward and fingers will naturally straighten. When moving wrist forward/back, gently and actively move fingers to enhance these natural movements.  Short-arc to mid-range active flexion of fingers - With wrist in extension (wrist moved back), actively bend fingers about 25% to 100% of the way down followed by intentional active straightening of fingers.

## 2023-09-23 NOTE — Therapy (Signed)
 OUTPATIENT OCCUPATIONAL THERAPY ORTHO TREATMENT  Patient Name: Chase Jones MRN: [id number] DOB:08-20-1996, 28 y.o., male Today's Date: 09/23/2023  PCP: none REFERRING PROVIDER: Sebastian Lenis, MD  END OF SESSION:  OT End of Session - 09/23/23 1027     Visit Number 5    Number of Visits 12    Date for OT Re-Evaluation 10/26/23    Authorization Type [employer] - approved 10 visits    Authorization Time Period 08/31/23 - 10/30/23    Authorization - Number of Visits 10    OT Start Time 0933    OT Stop Time 1013    OT Time Calculation (min) 40 min    Activity Tolerance Patient tolerated treatment well    Behavior During Therapy WFL for tasks assessed/performed               Past Medical History:  Diagnosis Date   Diabetes mellitus without complication (HCC)    Hypertension    No past surgical history on file. There are no active problems to display for this patient.   ONSET DATE: 08/18/2023  REFERRING DIAG: Lt wrist EDC (4), and EIP, EDQ  repair -DOS 08/18/23  THERAPY DIAG:  Pain in left wrist  Stiffness of left wrist, not elsewhere classified  Muscle weakness (generalized)  Other disturbances of skin sensation  Rationale for Evaluation and Treatment: Rehabilitation  SUBJECTIVE:   SUBJECTIVE STATEMENT: Pt is 5 weeks s/p today  Pt reported his phone number has changed. OT recommended to pt to change contact info with front desk and educated pt on no-show/late policy. Pt verbalized understanding of all.   Pt reported feeling a slightly bruise near lateral aspect of scar.  Pt reported completing HEP and improved ability to complete exercises.  Pt accompanied by: self  PERTINENT HISTORY: DM, HTN  PRECAUTIONS: Other: per protocol  RED FLAGS: None   WEIGHT BEARING RESTRICTIONS:non wt bearing through Lt hand  PAIN:  Are you having pain? No pain today. Pt c/o small bruise near lateral aspect of scar.  FALLS: Has patient fallen in last 6  months? No  LIVING ENVIRONMENT: Lives with: lives with their family  PLOF: Independent and in between jobs  PATIENT GOALS: Get my hand better  NEXT MD VISIT: 08/31/23  OBJECTIVE:  Note: Objective measures were completed at Evaluation unless otherwise noted.  HAND DOMINANCE: Right  ADLs: Overall ADLs: mod I for BADLS, aunt doing IADLS  FUNCTIONAL OUTCOME MEASURES: Quick Dash: 45.5% deficit  UPPER EXTREMITY ROM:   BUE AROM at shoulders and elbows WNL's   Lt thumb intact. Unable to assess Lt wrist and and MP's due to precautions. Finger PIP joints WFL's - limited to approx 90* for splint    HAND FUNCTION: Did not assess grip or pinch strength d/t current precautions  COORDINATION: Not assessed d/t current precautions  SENSATION: Reports intact now  EDEMA: mild dorsal hand and fingers  COGNITION: Overall cognitive status: Within functional limits for tasks assessed  OBSERVATIONS: Pt arrived fully wrapped/protected Lt hand/wrist (with IP's free) from surgery    TODAY'S TREATMENT:  DATE:   Pt now 5 weeks post-op.   Ther[employer] OT provided pt with x3 new straps for splint per pt request.  OT noted small bruise at lateral side of pt's scar. Pt stated that pt may have bumped affected wrist or washed wrist with too much pressure using a rag. OT educated pt on appropriate pressure of scar massage and desensitization. Pt verbalized understanding. OT recommended to monitor bruise healing and pt verbalized understanding.  TherEx: OT initiated updated AROM exercises of affected UE per information from 5th edition [place]  Hand Protocol. Handout provided, see pt instructions.   Pt denied pain with all exercises. Pt pointed out some spasms of R index finger with AROM. OT noted spasms were possibly d/t presence of scar tissue and/or adhesions and general  non-use secondary to NWB precaution of affected UE precautions. Pt continued to deny pain with all exercises.  OT noted pt demo'd improved AROM of all digits of affected UE compared to previous OT session. OT noted pt demo'd some extensor lag of affected UE digit 5 ext compared to unaffected hand when completing isolated digit 5 extension.  Manual: OT completed STM scar massage of affected UE over full scar - circles, zig-zag, parallel, perpendicular, and cross-friction with 2 fingers (moonwalk - each finger moving in opposite directions) scar massage - to decrease scar tightness, to promote scar tissue remodeling. OT noted that scar appeared to be healing well. Pt verbalized understanding of scar massage techniques.   PATIENT EDUCATION: Education details: see today's treatment above, UE protocol, precautions (NWB, avoid lifting/strengthening, avoid opening jars/containers) Person educated: Patient Education method: Programmer, Multimedia, Demonstration, Verbal cues, and Handouts Education comprehension: verbalized understanding  HOME EXERCISE PROGRAM: 08/25/23: Splint wear and care, hygiene care, current precautions 09/07/23: initial 3 week post op protocol 09/14/23: initial 4 week post op protocol 09/23/23: updated AROM HEP per [place]  Hand Protocol 5th Edition  GOALS: Goals reviewed with patient? Yes  SHORT TERM GOALS: Target date: 09/25/23  Independent with splint wear and care Baseline: issued, may need adjustments Goal status: MET  2.  Pt to verbalize understanding with scar massage, edema management techniques Baseline: not yet addressed Goal status: MET  3.  Pt to be independent with initial A/ROM HEP  Baseline: not yet addressed for wrist and MP's d/t current precautions Goal status: IN PROGRESS  4.  Pt to report pain less than or equal to 5/10 with initial A/ROM  Baseline: 7-9/10 09/23/23 - Pt denied pain with all updated HEP during affected UE AROM Goal status: IN  PROGRESS   LONG TERM GOALS: Target date: 10/26/23  Independent with updated strengthening HEP  Baseline: Dependent d/t current precautions Goal status: INITIAL  2.  Pt to demo wrist and MP ROM LUE WFL's for all functional tasks Baseline: unable to assess d/t current precautions Goal status: INITIAL  3.  Pt to perform grip strength Lt hand that is 50% or more compared to Rt dominant hand Baseline: unable to assess d/t current precautions Goal status: INITIAL  4.  Pt to return to using Lt hand as assist for all bilateral tasks Baseline: unable d/t current precautions Goal status: INITIAL  5.  Quick Dash to improve to 30% deficit or less Baseline: 45.5% Goal status: INITIAL   ASSESSMENT:  CLINICAL IMPRESSION: Pt tolerated tasks well and demo'd improved AROM of affected UE compared to previous OT session. Continue to progress per protocol. Pt would benefit from continued skilled OT services in the outpatient setting to work on impairments as noted  below to help pt return to PLOF as able.    PERFORMANCE DEFICITS: in functional skills including ADLs, IADLs, coordination, dexterity, edema, ROM, strength, pain, Fine motor control, decreased knowledge of precautions, skin integrity, and UE functional use,.   IMPAIRMENTS: are limiting patient from ADLs, IADLs, work, and social participation.   COMORBIDITIES: may have co-morbidities  that affects occupational performance. Patient will benefit from skilled OT to address above impairments and improve overall function.  MODIFICATION OR ASSISTANCE TO COMPLETE EVALUATION: No modification of tasks or assist necessary to complete an evaluation.  OT OCCUPATIONAL PROFILE AND HISTORY: Problem focused assessment: Including review of records relating to presenting problem.  CLINICAL DECISION MAKING: Moderate - several treatment options, min-mod task modification necessary  REHAB POTENTIAL: Good  EVALUATION COMPLEXITY: Low      PLAN:  OT  FREQUENCY: 1x/week  OT DURATION: 2 weeks, followed by 2x/wk for 4 weeks  PLANNED INTERVENTIONS: 97535 self care/ADL training, 02889 therapeutic exercise, 97530 therapeutic activity, 97140 manual therapy, 97035 ultrasound, 97018 paraffin, 02960 fluidotherapy, 97010 moist heat, 97034 contrast bath, 97014 electrical stimulation unattended, 97760 Orthotics management and training, 02239 Splinting (initial encounter), scar mobilization, passive range of motion, compression bandaging, patient/family education, and DME and/or AE instructions  RECOMMENDED OTHER SERVICES: none at this time  CONSULTED AND AGREED WITH PLAN OF CARE: Patient  PLAN FOR NEXT SESSION: continue pulsed US , continue 5-6 week post-op protocol, Dycem for scar mobilization    For all possible CPT codes, reference the Planned Interventions line above.     Check all conditions that are expected to impact treatment: {Conditions expected to impact treatment:None of these apply   If treatment provided at initial evaluation, no treatment charged due to lack of authorization.        Geofm FORBES Coder, OT 09/23/2023, 10:37 AM

## 2023-09-28 ENCOUNTER — Encounter: Payer: Self-pay | Admitting: Occupational Therapy

## 2023-09-28 ENCOUNTER — Ambulatory Visit: Payer: Medicaid Other | Admitting: Occupational Therapy

## 2023-09-28 DIAGNOSIS — R278 Other lack of coordination: Secondary | ICD-10-CM

## 2023-09-28 DIAGNOSIS — M25632 Stiffness of left wrist, not elsewhere classified: Secondary | ICD-10-CM

## 2023-09-28 DIAGNOSIS — M25532 Pain in left wrist: Secondary | ICD-10-CM | POA: Diagnosis not present

## 2023-09-28 DIAGNOSIS — M6281 Muscle weakness (generalized): Secondary | ICD-10-CM

## 2023-09-28 DIAGNOSIS — R208 Other disturbances of skin sensation: Secondary | ICD-10-CM

## 2023-09-28 NOTE — Therapy (Signed)
 OUTPATIENT OCCUPATIONAL THERAPY ORTHO TREATMENT  Patient Name: Chase Jones MRN: [id number] DOB:Jul 19, 1996, 28 y.o., male Today's Date: 09/28/2023  PCP: none REFERRING PROVIDER: Sebastian Lenis, MD  END OF SESSION:  OT End of Session - 09/28/23 0935     Visit Number 6    Number of Visits 12    Date for OT Re-Evaluation 10/26/23    Authorization Type [employer] - approved 10 visits    Authorization Time Period 08/31/23 - 10/30/23    Authorization - Number of Visits 10    OT Start Time 0933    OT Stop Time 1015    OT Time Calculation (min) 42 min    Activity Tolerance Patient tolerated treatment well    Behavior During Therapy WFL for tasks assessed/performed               Past Medical History:  Diagnosis Date   Diabetes mellitus without complication (HCC)    Hypertension    History reviewed. No pertinent surgical history. There are no active problems to display for this patient.   ONSET DATE: 08/18/2023  REFERRING DIAG: Lt wrist EDC (4), and EIP, EDQ  repair -DOS 08/18/23  THERAPY DIAG:  Stiffness of left wrist, not elsewhere classified  Muscle weakness (generalized)  Other disturbances of skin sensation  Other lack of coordination  Rationale for Evaluation and Treatment: Rehabilitation  SUBJECTIVE:   SUBJECTIVE STATEMENT: Pt is almost 6 weeks post-op, however protocol delayed slightly d/t missed visits. No pain reported today  Pt reported his phone number has changed. OT recommended to pt to change contact info with front desk and educated pt on no-show/late policy. Pt verbalized understanding of all.   Pt reported feeling a slightly bruise near lateral aspect of scar.  Pt reported completing HEP and improved ability to complete exercises.  Pt accompanied by: self  PERTINENT HISTORY: DM, HTN  PRECAUTIONS: Other: per protocol  RED FLAGS: None   WEIGHT BEARING RESTRICTIONS:non wt bearing through Lt hand  PAIN:  Are you having pain? No  pain today. Pt c/o small bruise near lateral aspect of scar.  FALLS: Has patient fallen in last 6 months? No  LIVING ENVIRONMENT: Lives with: lives with their family  PLOF: Independent and in between jobs  PATIENT GOALS: Get my hand better  NEXT MD VISIT: 08/31/23  OBJECTIVE:  Note: Objective measures were completed at Evaluation unless otherwise noted.  HAND DOMINANCE: Right  ADLs: Overall ADLs: mod I for BADLS, aunt doing IADLS  FUNCTIONAL OUTCOME MEASURES: Quick Dash: 45.5% deficit  UPPER EXTREMITY ROM:   BUE AROM at shoulders and elbows WNL's   Lt thumb intact. Unable to assess Lt wrist and and MP's due to precautions. Finger PIP joints WFL's - limited to approx 90* for splint    HAND FUNCTION: Did not assess grip or pinch strength d/t current precautions  COORDINATION: Not assessed d/t current precautions  SENSATION: Reports intact now  EDEMA: mild dorsal hand and fingers  COGNITION: Overall cognitive status: Within functional limits for tasks assessed  OBSERVATIONS: Pt arrived fully wrapped/protected Lt hand/wrist (with IP's free) from surgery    TODAY'S TREATMENT:  DATE:   Pt now almost 6 weeks post-op.    TherEx: Reviewed previous A/ROM HEP from 4 weeks post-op.   Issued updated HEP to include: extensor stretch for extrinsic tightness (light fist while attempted wrist flex), composite passive hand flexion w/ wrist in extension, and MP extension - pt provided buddy straps with last ex to assist w/ long and ring finger MP extension (long finger to index, ring to small). Noted lag in MP extension long and ring fingers but improves w/ buddy strapping to adjacent fingers  Pt also shown passive wrist flexion w/ fingers open d/t extrinsic tightness.  Pt instructed to continue with previously issued AROM HEP w/ emphasis on MP flex and  ext while IP's are kept flexed.   Ultrasound: Ultrasound x 8 minutes, 3 Mhz, 1.0 wts/cm2, 20% pulsed over dorsal wrist/forearm for scar tissue management followed by stretching extensor musculature d/t extrinsic tightness.   Pt also instructed to remove splint during the day for 3 - one hour sessions for light ADLS (per protocol)     PATIENT EDUCATION: Education details: see today's treatment above, UE protocol, precautions (NWB, avoid lifting/strengthening, avoid opening jars/containers) Person educated: Patient Education method: Programmer, Multimedia, Demonstration, Verbal cues, and Handouts Education comprehension: verbalized understanding  HOME EXERCISE PROGRAM: 08/25/23: Splint wear and care, hygiene care, current precautions 09/07/23: initial 3 week post op protocol 09/14/23: initial 4 week post op protocol 09/23/23: updated AROM HEP per [place]  Hand Protocol 5th Edition 09/28/23: began passive stretches per 5 week post-op   GOALS: Goals reviewed with patient? Yes  SHORT TERM GOALS: Target date: 09/25/23  Independent with splint wear and care Baseline: issued, may need adjustments Goal status: MET  2.  Pt to verbalize understanding with scar massage, edema management techniques Baseline: not yet addressed Goal status: MET  3.  Pt to be independent with initial A/ROM HEP  Baseline: not yet addressed for wrist and MP's d/t current precautions Goal status: MET  4.  Pt to report pain less than or equal to 5/10 with initial A/ROM  Baseline: 7-9/10 09/23/23 - Pt denied pain with all updated HEP during affected UE AROM Goal status: IN PROGRESS   LONG TERM GOALS: Target date: 10/26/23  Independent with updated strengthening HEP  Baseline: Dependent d/t current precautions Goal status: INITIAL  2.  Pt to demo wrist and MP ROM LUE WFL's for all functional tasks Baseline: unable to assess d/t current precautions Goal status: INITIAL  3.  Pt to perform grip strength Lt hand that is 50% or  more compared to Rt dominant hand Baseline: unable to assess d/t current precautions Goal status: INITIAL  4.  Pt to return to using Lt hand as assist for all bilateral tasks Baseline: unable d/t current precautions Goal status: INITIAL  5.  Quick Dash to improve to 30% deficit or less Baseline: 45.5% Goal status: INITIAL   ASSESSMENT:  CLINICAL IMPRESSION: Pt tolerated tasks well and demo'd improved AROM of affected UE. Pt continues to demo MP extensor lag of long and ring fingers and unable to demo composite extension of wrist and MP's except at index finger. Pt also w/ noted extensor extrinsic tightness. Pt would benefit from continued skilled OT services in the outpatient setting to work on impairments as noted below to help pt return to PLOF as able.    PERFORMANCE DEFICITS: in functional skills including ADLs, IADLs, coordination, dexterity, edema, ROM, strength, pain, Fine motor control, decreased knowledge of precautions, skin integrity, and UE functional use,.  IMPAIRMENTS: are limiting patient from ADLs, IADLs, work, and social participation.   COMORBIDITIES: may have co-morbidities  that affects occupational performance. Patient will benefit from skilled OT to address above impairments and improve overall function.  MODIFICATION OR ASSISTANCE TO COMPLETE EVALUATION: No modification of tasks or assist necessary to complete an evaluation.  OT OCCUPATIONAL PROFILE AND HISTORY: Problem focused assessment: Including review of records relating to presenting problem.  CLINICAL DECISION MAKING: Moderate - several treatment options, min-mod task modification necessary  REHAB POTENTIAL: Good  EVALUATION COMPLEXITY: Low      PLAN:  OT FREQUENCY: 1x/week  OT DURATION: 2 weeks, followed by 2x/wk for 4 weeks  PLANNED INTERVENTIONS: 97535 self care/ADL training, 02889 therapeutic exercise, 97530 therapeutic activity, 97140 manual therapy, 97035 ultrasound, 97018 paraffin,  02960 fluidotherapy, 97010 moist heat, 97034 contrast bath, 97014 electrical stimulation unattended, 97760 Orthotics management and training, 02239 Splinting (initial encounter), scar mobilization, passive range of motion, compression bandaging, patient/family education, and DME and/or AE instructions  RECOMMENDED OTHER SERVICES: none at this time  CONSULTED AND AGREED WITH PLAN OF CARE: Patient  PLAN FOR NEXT SESSION: continue pulsed US , continue 5-6 week post-op protocol, Dycem for scar mobilization, continue extrinsic stretches in composite wrist and finger flex, and isolated MP extension as able, continue to gradually reduce wearing time of splint per 6 week recommendations, begin light putty for max composite hand flex and if able for MP extension    For all possible CPT codes, reference the Planned Interventions line above.     Check all conditions that are expected to impact treatment: {Conditions expected to impact treatment:None of these apply   If treatment provided at initial evaluation, no treatment charged due to lack of authorization.        Burnard JINNY Roads, OT 09/28/2023, 9:35 AM

## 2023-09-28 NOTE — Patient Instructions (Signed)
 Composite Flexion (Active Extensor Stretch)    Curl fingers into fist, bend wrist down, and straighten elbow. Hold __10-20__ seconds. Repeat _5___ times. Do __4-6__ sessions per day.  2. Keep wrist up to slightly back, bend large knuckles down and hold down while making fist by bending fingers. Hold 5 sec, Repeat 10 times, 4-6 times per day  3. MP Extension (Active)    With palm on table, straighten fingers completely at large knuckles, and lift fingers off table. Can buddy strap as needed. Hold __3__ seconds. Repeat _10___ times. Do _4-6___ sessions per day. Activity: Tap fingers one at a time on table.*  Also, can take off splint for 3 - one hour sessions during the day for LIGHT (less than 1 lb) self care activities. No resistance allowed.

## 2023-09-30 ENCOUNTER — Encounter (INDEPENDENT_AMBULATORY_CARE_PROVIDER_SITE_OTHER): Payer: Self-pay

## 2023-09-30 ENCOUNTER — Ambulatory Visit: Payer: Medicaid Other | Admitting: Occupational Therapy

## 2023-10-05 ENCOUNTER — Encounter: Payer: Self-pay | Admitting: Occupational Therapy

## 2023-10-05 ENCOUNTER — Ambulatory Visit: Payer: Medicaid Other | Admitting: Occupational Therapy

## 2023-10-05 DIAGNOSIS — R208 Other disturbances of skin sensation: Secondary | ICD-10-CM

## 2023-10-05 DIAGNOSIS — M25632 Stiffness of left wrist, not elsewhere classified: Secondary | ICD-10-CM

## 2023-10-05 DIAGNOSIS — R278 Other lack of coordination: Secondary | ICD-10-CM

## 2023-10-05 DIAGNOSIS — M6281 Muscle weakness (generalized): Secondary | ICD-10-CM

## 2023-10-05 DIAGNOSIS — M25532 Pain in left wrist: Secondary | ICD-10-CM | POA: Diagnosis not present

## 2023-10-05 NOTE — Therapy (Addendum)
 OUTPATIENT OCCUPATIONAL THERAPY ORTHO TREATMENT  Patient Name: Chase Jones MRN: [id number] DOB:1995/10/28, 28 y.o., male Today's Date: 10/05/2023  PCP: none REFERRING PROVIDER: Sebastian Lenis, MD  END OF SESSION:  OT End of Session - 10/05/23 0936     Visit Number 7    Number of Visits 12    Date for OT Re-Evaluation 10/26/23    Authorization Type [employer] - approved 10 visits    Authorization Time Period 08/31/23 - 10/30/23    Authorization - Number of Visits 10    OT Start Time 0932    OT Stop Time 1015    OT Time Calculation (min) 43 min    Activity Tolerance Patient tolerated treatment well    Behavior During Therapy WFL for tasks assessed/performed               Past Medical History:  Diagnosis Date   Diabetes mellitus without complication (HCC)    Hypertension    History reviewed. No pertinent surgical history. There are no active problems to display for this patient.   ONSET DATE: 08/18/2023  REFERRING DIAG: Lt wrist EDC (4), and EIP, EDQ  repair -DOS 08/18/23  THERAPY DIAG:  Stiffness of left wrist, not elsewhere classified  Muscle weakness (generalized)  Other disturbances of skin sensation  Other lack of coordination  Pain in left wrist  Rationale for Evaluation and Treatment: Rehabilitation  SUBJECTIVE:   SUBJECTIVE STATEMENT: Pt is almost 7 weeks post-op, however protocol delayed slightly d/t missed visits. No pain reported today  Pt reported his phone number has changed. OT recommended to pt to change contact info with front desk and educated pt on no-show/late policy. Pt verbalized understanding of all.   Pt reported feeling a slightly bruise near lateral aspect of scar.  Pt reported completing HEP and improved ability to complete exercises.  Pt accompanied by: self  PERTINENT HISTORY: DM, HTN  PRECAUTIONS: Other: per protocol  RED FLAGS: None   WEIGHT BEARING RESTRICTIONS:non wt bearing through Lt hand  PAIN:   Are you having pain? No pain today. Pt c/o small bruise near lateral aspect of scar.  FALLS: Has patient fallen in last 6 months? No  LIVING ENVIRONMENT: Lives with: lives with their family  PLOF: Independent and in between jobs  PATIENT GOALS: Get my hand better  NEXT MD VISIT: 08/31/23  OBJECTIVE:  Note: Objective measures were completed at Evaluation unless otherwise noted.  HAND DOMINANCE: Right  ADLs: Overall ADLs: mod I for BADLS, aunt doing IADLS  FUNCTIONAL OUTCOME MEASURES: Quick Dash: 45.5% deficit  UPPER EXTREMITY ROM:   BUE AROM at shoulders and elbows WNL's   Lt thumb intact. Unable to assess Lt wrist and and MP's due to precautions. Finger PIP joints WFL's - limited to approx 90* for splint    HAND FUNCTION: Did not assess grip or pinch strength d/t current precautions  COORDINATION: Not assessed d/t current precautions  SENSATION: Reports intact now  EDEMA: mild dorsal hand and fingers  COGNITION: Overall cognitive status: Within functional limits for tasks assessed  OBSERVATIONS: Pt arrived fully wrapped/protected Lt hand/wrist (with IP's free) from surgery    TODAY'S TREATMENT:  DATE:   Pt now almost 7 weeks post-op.    TherEx:  In addition to previous ex's (isolated MP flex with IP's flexed, MP extension w/ fingers straight on table, extrinsic active stretch in composite hand and wrist flexion as able, passive wrist flex with fingers open) added gentle passive stretch in composite wrist and hand flexion,  hand strengthening in composite flexion with yellow putty, and light resistive MP extension (using thin rubberband) per protocol.   Also instructed to continue reducing wearing time of splint during the day taking off 6 - one hour sessions per protocol. Pt instructed to continue to wear at night.    Manual Therapy:   In addition to scar massage, pt shown scar massage w/ use of dycem to increase mobilization and gliding.   Ultrasound: Ultrasound x 8 minutes, 3 Mhz, 1.0 wts/cm2, 20% pulsed over dorsal wrist/forearm for scar tissue management followed by stretching extensor musculature d/t extrinsic tightness.   Followed by stretch in wrist flex isolated, composite hand flexion, then combined wrist and finger flexion as able.   **Pt still has to use tenodesis due to extrinsic extensor tightness      PATIENT EDUCATION: Education details: see today's treatment above, UE protocol, precautions (NWB, avoid lifting/strengthening, avoid opening jars/containers) Person educated: Patient Education method: Programmer, Multimedia, Demonstration, Verbal cues, and Handouts Education comprehension: verbalized understanding  HOME EXERCISE PROGRAM: 08/25/23: Splint wear and care, hygiene care, current precautions 09/07/23: initial 3 week post op protocol 09/14/23: initial 4 week post op protocol 09/23/23: updated AROM HEP per [place]  Hand Protocol 5th Edition 09/28/23: began passive stretches per 5 week post-op   GOALS: Goals reviewed with patient? Yes  SHORT TERM GOALS: Target date: 09/25/23  Independent with splint wear and care Baseline: issued, may need adjustments Goal status: MET  2.  Pt to verbalize understanding with scar massage, edema management techniques Baseline: not yet addressed Goal status: MET  3.  Pt to be independent with initial A/ROM HEP  Baseline: not yet addressed for wrist and MP's d/t current precautions Goal status: MET  4.  Pt to report pain less than or equal to 5/10 with initial A/ROM  Baseline: 7-9/10 09/23/23 - Pt denied pain with all updated HEP during affected UE AROM Goal status: IN PROGRESS   LONG TERM GOALS: Target date: 10/26/23  Independent with updated strengthening HEP  Baseline: Dependent d/t current precautions Goal status: INITIAL  2.  Pt to demo wrist and MP ROM LUE  WFL's for all functional tasks Baseline: unable to assess d/t current precautions Goal status: INITIAL  3.  Pt to perform grip strength Lt hand that is 50% or more compared to Rt dominant hand Baseline: unable to assess d/t current precautions Goal status: INITIAL  4.  Pt to return to using Lt hand as assist for all bilateral tasks Baseline: unable d/t current precautions Goal status: INITIAL  5.  Quick Dash to improve to 30% deficit or less Baseline: 45.5% Goal status: INITIAL   ASSESSMENT:  CLINICAL IMPRESSION: Pt tolerated tasks well and demo'd improved AROM of affected LUE. Pt continues to demo MP extensor lag of long and ring fingers but improved since last week, and unable to demo composite extension of wrist and MP's except at index finger. Pt also w/ noted extensor extrinsic tightness and using tenodesis. Pt progressing per protocol w/ slight delay d/t missed visits. Pt would benefit from continued skilled OT services in the outpatient setting to work on impairments as noted below to help pt  return to PLOF as able.    PERFORMANCE DEFICITS: in functional skills including ADLs, IADLs, coordination, dexterity, edema, ROM, strength, pain, Fine motor control, decreased knowledge of precautions, skin integrity, and UE functional use,.   IMPAIRMENTS: are limiting patient from ADLs, IADLs, work, and social participation.   COMORBIDITIES: may have co-morbidities  that affects occupational performance. Patient will benefit from skilled OT to address above impairments and improve overall function.  MODIFICATION OR ASSISTANCE TO COMPLETE EVALUATION: No modification of tasks or assist necessary to complete an evaluation.  OT OCCUPATIONAL PROFILE AND HISTORY: Problem focused assessment: Including review of records relating to presenting problem.  CLINICAL DECISION MAKING: Moderate - several treatment options, min-mod task modification necessary  REHAB POTENTIAL: Good  EVALUATION  COMPLEXITY: Low      PLAN:  OT FREQUENCY: 1x/week  OT DURATION: 2 weeks, followed by 2x/wk for 4 weeks  PLANNED INTERVENTIONS: 97535 self care/ADL training, 02889 therapeutic exercise, 97530 therapeutic activity, 97140 manual therapy, 97035 ultrasound, 97018 paraffin, 02960 fluidotherapy, 97010 moist heat, 97034 contrast bath, 97014 electrical stimulation unattended, 97760 Orthotics management and training, 02239 Splinting (initial encounter), scar mobilization, passive range of motion, compression bandaging, patient/family education, and DME and/or AE instructions  RECOMMENDED OTHER SERVICES: none at this time  CONSULTED AND AGREED WITH PLAN OF CARE: Patient  PLAN FOR NEXT SESSION: continue pulsed US , continue 6 week post-op protocol, continue light putty for max composite hand flex and if able for MP extension, assess grip strength    For all possible CPT codes, reference the Planned Interventions line above.     Check all conditions that are expected to impact treatment: {Conditions expected to impact treatment:None of these apply   If treatment provided at initial evaluation, no treatment charged due to lack of authorization.        Burnard JINNY Roads, OT 10/05/2023, 9:38 AM

## 2023-10-05 NOTE — Patient Instructions (Signed)
 1. Grip Strengthening (Resistive Putty)   Squeeze putty using thumb and all fingers. Repeat _20___ times. Do __2-3__ sessions per day.  Finger / Thumb Extensors    Place right fingers and thumb in center of thin rubberband, stretch out. Repeat __10__ times. Do _3___ sessions per day.   Composite Flexion (PASSIVE Extensor Stretch)    Curl fingers into fist, bend wrist down, and straighten elbow. GENTLY PUSH DOWN WITH OTHER HAND.  Hold __10__ seconds. Repeat _5___ times. Do __4-6__ sessions per day.

## 2023-10-07 ENCOUNTER — Ambulatory Visit: Payer: Medicaid Other | Admitting: Occupational Therapy

## 2023-10-12 ENCOUNTER — Ambulatory Visit: Payer: Medicaid Other | Admitting: Occupational Therapy

## 2023-10-12 DIAGNOSIS — M6281 Muscle weakness (generalized): Secondary | ICD-10-CM

## 2023-10-12 DIAGNOSIS — R208 Other disturbances of skin sensation: Secondary | ICD-10-CM

## 2023-10-12 DIAGNOSIS — M25532 Pain in left wrist: Secondary | ICD-10-CM | POA: Diagnosis not present

## 2023-10-12 DIAGNOSIS — R278 Other lack of coordination: Secondary | ICD-10-CM

## 2023-10-12 DIAGNOSIS — M25632 Stiffness of left wrist, not elsewhere classified: Secondary | ICD-10-CM

## 2023-10-12 NOTE — Therapy (Signed)
OUTPATIENT OCCUPATIONAL THERAPY ORTHO TREATMENT  Patient Name: Chase Jones MRN: [id number] DOB:12-25-1995, 28 y.o., male Today's Date: 10/12/2023  PCP: none REFERRING PROVIDER: Mack Hook, MD  END OF SESSION:  OT End of Session - 10/12/23 1231     Visit Number 8    Number of Visits 12    Date for OT Re-Evaluation 10/26/23    Authorization Type [employer] - approved 10 visits    Authorization Time Period 08/31/23 - 10/30/23    Authorization - Number of Visits 10    OT Start Time 0941   pt arrival time   OT Stop Time 1015    OT Time Calculation (min) 34 min    Activity Tolerance Patient tolerated treatment well    Behavior During Therapy WFL for tasks assessed/performed                Past Medical History:  Diagnosis Date   Diabetes mellitus without complication (HCC)    Hypertension    No past surgical history on file. There are no active problems to display for this patient.   ONSET DATE: 08/18/2023  REFERRING DIAG: Lt wrist EDC (4), and EIP, EDQ  repair -DOS 08/18/23  THERAPY DIAG:  Stiffness of left wrist, not elsewhere classified  Muscle weakness (generalized)  Other disturbances of skin sensation  Other lack of coordination  Pain in left wrist  Rationale for Evaluation and Treatment: Rehabilitation  SUBJECTIVE:   SUBJECTIVE STATEMENT: Pt s/p 7 weeks. Pt reported things are going well. Pt reported improved movement since weaning off splint. Pt reported completing exercises, including using theraputty and rubber band. Pt reported wearing splint at nighttime only and keeping splint off for x6 1-hour sessions during the day.   Pt accompanied by: self  PERTINENT HISTORY: DM, HTN  PRECAUTIONS: Other: per protocol  RED FLAGS: None   WEIGHT BEARING RESTRICTIONS:non wt bearing through Lt hand  PAIN:  Are you having pain? 2/10 pain between digit 2 and digit 3 metacarpal of L hand.  FALLS: Has patient fallen in last 6 months?  No  LIVING ENVIRONMENT: Lives with: lives with their family  PLOF: Independent and in between jobs  PATIENT GOALS: Get my hand better  NEXT MD VISIT: 08/31/23  OBJECTIVE:  Note: Objective measures were completed at Evaluation unless otherwise noted.  HAND DOMINANCE: Right  ADLs: Overall ADLs: mod I for BADLS, aunt doing IADLS  FUNCTIONAL OUTCOME MEASURES: Quick Dash: 45.5% deficit  UPPER EXTREMITY ROM:   BUE AROM at shoulders and elbows WNL's   Lt thumb intact. Unable to assess Lt wrist and and MP's due to precautions. Finger PIP joints WFL's - limited to approx 90* for splint    HAND FUNCTION: Did not assess grip or pinch strength d/t current precautions  10/12/23 - LUE: 44 lbs (pt denied pain), RUE: 85 lbs.  COORDINATION: Not assessed d/t current precautions  SENSATION: Reports intact now  EDEMA: mild dorsal hand and fingers  COGNITION: Overall cognitive status: Within functional limits for tasks assessed  OBSERVATIONS: Pt arrived fully wrapped/protected Lt hand/wrist (with IP's free) from surgery    TODAY'S TREATMENT:  DATE:    TherAct Per pt request, OT provided pt with additional Velcro pieces and additional straps for splint. Pt accurately recalled splint wear schedule ind.  OT educated pt on UE anatomy. Pt verbalized understanding.  OT assessed pt's progress towards goals, see below for updates.   OT assessed pt's grip strength: LUE: 44 lbs (pt denied pain), RUE: 85 lbs. OT educated pt on precautions and avoiding heavy lifting during daily tasks. Pt verbalized understanding. OT educated pt on continuing HEP. Pt verbalized understanding.   OT assessed pt's affected UE AROM. Full composite fist of affected UE - WFL with wrist neutral. Limited wrist flex when completing composite digit flex of affected UE. Pt denied pain with AROM.  Pt able to achieve MCP hyperext of digits 2, 4, 5 with "finger taps" on tabletop (pronated). Pt continues to demo difficulty with ext of digit 3 for "finger taps" on tabletop (pronated).  TherEx Fluidotherapy - 9 minutes - OT educated pt on purpose of fluidotherapy. Pt verbalized understanding. Pt placed LUE in Fluidotherapy machine with supervised ROM x 9 min. Pt was educated to complete MCP digit ext AROM, progress from AROM 1) full finger ext with wrist flex to 2) DIP/PIP flexion and MCP ext with wrist flex to 3) full digit composite flex with wrist flex during modality time to improve ROM and decrease pain/stiffness of affected extremity by use of the machine's massaging action and thermal properties.     OT reviewed HEP: Resisted Yellow Putty/rubber band exercises with supervision, per 10/05/23 pt instructions: composite flexion (20 reps, yellow theraputty) and digit extension (10 reps, rubber band) - to improve affected UE strengthening and AROM - Pt returned demonstration of exercises and verbalized understanding of HEP schedule. Pt denied pain with exercises.  HEP update to improved affected UE AROM: 1) full finger ext with wrist flex to 2) DIP/PIP flexion and MCP ext with wrist flex to 3) full digit composite flex with wrist flex . Pt returned demonstration of exercises.   PATIENT EDUCATION: Education details: see today's treatment above Person educated: Patient Education method: Explanation, Demonstration, Verbal cues, and Handouts Education comprehension: verbalized understanding  HOME EXERCISE PROGRAM: 08/25/23: Splint wear and care, hygiene care, current precautions 09/07/23: initial 3 week post op protocol 09/14/23: initial 4 week post op protocol 09/23/23: updated AROM HEP per [place] Hand Protocol 5th Edition 09/28/23: began passive stretches per 5 week post-op  10/12/23 - Verbal instructions: affected UE AROM progression 1) full finger ext with wrist flex to 2) DIP/PIP flexion and  MCP ext with wrist flex to 3) full digit composite flex with wrist flex   GOALS: Goals reviewed with patient? Yes  SHORT TERM GOALS: Target date: 09/25/23  Independent with splint wear and care Baseline: issued, may need adjustments Goal status: MET  2.  Pt to verbalize understanding with scar massage, edema management techniques Baseline: not yet addressed Goal status: MET  3.  Pt to be independent with initial A/ROM HEP  Baseline: not yet addressed for wrist and MP's d/t current precautions Goal status: MET  4.  Pt to report pain less than or equal to 5/10 with initial A/ROM  Baseline: 7-9/10 09/23/23 - Pt denied pain with all updated HEP during affected UE AROM 10/12/23 - Pt reported 2/10 pain between digit 2 and digit 3 metacarpal of L hand. Goal status: MET   LONG TERM GOALS: Target date: 10/26/23  Independent with updated strengthening HEP  Baseline: Dependent d/t current precautions 10/12/23 - Pt demo'd understanding  of yellow theraputty digit flex and rubber band digit ext HEP. Goal status: in progress  2.  Pt to demo wrist and MP ROM LUE WFL's for all functional tasks Baseline: unable to assess d/t current precautions 10/12/23 - Full composite digit flex of affected UE - WFL with wrist neutral. Impaired wrist flex when completing composite digit flex of affected UE.  Goal status: in progress  3.  Pt to perform grip strength Lt hand that is 50% or more compared to Rt dominant hand Baseline: unable to assess d/t current precautions 10/12/23 - initial grip strength testing: LUE: 44 lbs (denied pain), RUE: 85 lbs.  Goal status: in progress  4.  Pt to return to using Lt hand as assist for all bilateral tasks Baseline: unable d/t current precautions 10/12/23 - Monitoring weaning out of splint Goal status: in progress  5.  Quick Dash to improve to 30% deficit or less Baseline: 45.5% Goal status: in progress   ASSESSMENT:  CLINICAL IMPRESSION: Pt tolerated initial grip  strength testing with no pain. Pt tolerated tasks well, including AROM and fluidotherapy. Pt making progress towards goals as expected and met 1 STG today. Recommended to continue to monitor weaning out of splint. Pt would benefit from continued skilled OT services in the outpatient setting to work on impairments as noted below to help pt return to PLOF as able.    PERFORMANCE DEFICITS: in functional skills including ADLs, IADLs, coordination, dexterity, edema, ROM, strength, pain, Fine motor control, decreased knowledge of precautions, skin integrity, and UE functional use,.   IMPAIRMENTS: are limiting patient from ADLs, IADLs, work, and social participation.   COMORBIDITIES: may have co-morbidities  that affects occupational performance. Patient will benefit from skilled OT to address above impairments and improve overall function.  MODIFICATION OR ASSISTANCE TO COMPLETE EVALUATION: No modification of tasks or assist necessary to complete an evaluation.  OT OCCUPATIONAL PROFILE AND HISTORY: Problem focused assessment: Including review of records relating to presenting problem.  CLINICAL DECISION MAKING: Moderate - several treatment options, min-mod task modification necessary  REHAB POTENTIAL: Good  EVALUATION COMPLEXITY: Low      PLAN:  OT FREQUENCY: 1x/week  OT DURATION: 2 weeks, followed by 2x/wk for 4 weeks  PLANNED INTERVENTIONS: 97535 self care/ADL training, 95621 therapeutic exercise, 97530 therapeutic activity, 97140 manual therapy, 97035 ultrasound, 97018 paraffin, 30865 fluidotherapy, 97010 moist heat, 97034 contrast bath, 97014 electrical stimulation unattended, 97760 Orthotics management and training, 78469 Splinting (initial encounter), scar mobilization, passive range of motion, compression bandaging, patient/family education, and DME and/or AE instructions  RECOMMENDED OTHER SERVICES: none at this time  CONSULTED AND AGREED WITH PLAN OF CARE: Patient  PLAN FOR NEXT  SESSION: schedule additional OT visits PRN (last visit scheduled for 10/14/23, continue pulsed [place], continue 6-7 week post-op protocol and progress per post-op protocol (pt to be 8 weeks s/p at next visit), continue light putty for max composite hand flex and if able for MP extension    For all possible CPT codes, reference the Planned Interventions line above.     Check all conditions that are expected to impact treatment: {Conditions expected to impact treatment:None of these apply   If treatment provided at initial evaluation, no treatment charged due to lack of authorization.        Wynetta Emery, OT 10/12/2023, 12:50 PM

## 2023-10-14 ENCOUNTER — Ambulatory Visit: Payer: Medicaid Other | Admitting: Occupational Therapy

## 2023-10-14 ENCOUNTER — Encounter: Payer: Self-pay | Admitting: Occupational Therapy

## 2023-10-14 DIAGNOSIS — M6281 Muscle weakness (generalized): Secondary | ICD-10-CM

## 2023-10-14 DIAGNOSIS — M25632 Stiffness of left wrist, not elsewhere classified: Secondary | ICD-10-CM

## 2023-10-14 DIAGNOSIS — M25532 Pain in left wrist: Secondary | ICD-10-CM | POA: Diagnosis not present

## 2023-10-14 DIAGNOSIS — R278 Other lack of coordination: Secondary | ICD-10-CM

## 2023-10-14 NOTE — Therapy (Signed)
OUTPATIENT OCCUPATIONAL THERAPY ORTHO TREATMENT  Patient Name: Chase Jones MRN: [id number] DOB:06-14-1996, 28 y.o., male Today's Date: 10/14/2023  PCP: none REFERRING PROVIDER: Mack Hook, MD  END OF SESSION:  OT End of Session - 10/14/23 0953     Visit Number 9    Number of Visits 12    Date for OT Re-Evaluation 10/26/23    Authorization Type [employer] - approved 10 visits    Authorization Time Period 08/31/23 - 10/30/23    Authorization - Number of Visits 10    OT Start Time 0935    OT Stop Time 1015    OT Time Calculation (min) 40 min    Activity Tolerance Patient tolerated treatment well    Behavior During Therapy WFL for tasks assessed/performed                Past Medical History:  Diagnosis Date   Diabetes mellitus without complication (HCC)    Hypertension    History reviewed. No pertinent surgical history. There are no active problems to display for this patient.   ONSET DATE: 08/18/2023  REFERRING DIAG: Lt wrist EDC (4), and EIP, EDQ  repair -DOS 08/18/23  THERAPY DIAG:  Stiffness of left wrist, not elsewhere classified  Muscle weakness (generalized)  Other lack of coordination  Rationale for Evaluation and Treatment: Rehabilitation  SUBJECTIVE:   SUBJECTIVE STATEMENT: Pt s/p 8 weeks as of today. Pt reported things are going well.  Pt accompanied by: self  PERTINENT HISTORY: DM, HTN  PRECAUTIONS: Other: per protocol  RED FLAGS: None   WEIGHT BEARING RESTRICTIONS:non wt bearing through Lt hand  PAIN:  Are you having pain? 2/10 pain between digit 2 and digit 3 metacarpal of L hand.  FALLS: Has patient fallen in last 6 months? No  LIVING ENVIRONMENT: Lives with: lives with their family  PLOF: Independent and in between jobs  PATIENT GOALS: Get my hand better  NEXT MD VISIT: 08/31/23  OBJECTIVE:  Note: Objective measures were completed at Evaluation unless otherwise noted.  HAND DOMINANCE: Right  ADLs: Overall  ADLs: mod I for BADLS, aunt doing IADLS  FUNCTIONAL OUTCOME MEASURES: Quick Dash: 45.5% deficit  UPPER EXTREMITY ROM:   BUE AROM at shoulders and elbows WNL's   Lt thumb intact. Unable to assess Lt wrist and and MP's due to precautions. Finger PIP joints WFL's - limited to approx 90* for splint    HAND FUNCTION: Did not assess grip or pinch strength d/t current precautions  10/12/23 - LUE: 44 lbs (pt denied pain), RUE: 85 lbs.  COORDINATION: Not assessed d/t current precautions  SENSATION: Reports intact now  EDEMA: mild dorsal hand and fingers  COGNITION: Overall cognitive status: Within functional limits for tasks assessed  OBSERVATIONS: Pt arrived fully wrapped/protected Lt hand/wrist (with IP's free) from surgery    TODAY'S TREATMENT:  DATE: 10/14/23  Pt instructed to wear extension splint only at night now per 8 week post op protocol  Ultrasound x 8 minutes, 3 Mhz, 1.0 wts/cm2, 20% pulsed over dorsal incision area for scar management  Continued extensor stretch for extrinsic tightness. Pt requires tenodesis for full wrist movement  Upgraded to red resistance putty for grip strength and performed 20 reps of grip strength.  Pt also instructed to add slightly more resistance for finger extension (using 2 thin rubber bands)  DigiExtend (2 level resistance) for finger extension with emphasis on MP extension as able  Pt issued finger tapping exercise   PATIENT EDUCATION: Education details: see today's treatment above Person educated: Patient Education method: Explanation, Demonstration, Verbal cues, and Handouts Education comprehension: verbalized understanding  HOME EXERCISE PROGRAM: 08/25/23: Splint wear and care, hygiene care, current precautions 09/07/23: initial 3 week post op protocol 09/14/23: initial 4 week post op protocol 09/23/23:  updated AROM HEP per [place] Hand Protocol 5th Edition 09/28/23: began passive stretches per 5 week post-op  10/12/23 - Verbal instructions: affected UE AROM progression 1) full finger ext with wrist flex to 2) DIP/PIP flexion and MCP ext with wrist flex to 3) full digit composite flex with wrist flex   GOALS: Goals reviewed with patient? Yes  SHORT TERM GOALS: Target date: 09/25/23  Independent with splint wear and care Baseline: issued, may need adjustments Goal status: MET  2.  Pt to verbalize understanding with scar massage, edema management techniques Baseline: not yet addressed Goal status: MET  3.  Pt to be independent with initial A/ROM HEP  Baseline: not yet addressed for wrist and MP's d/t current precautions Goal status: MET  4.  Pt to report pain less than or equal to 5/10 with initial A/ROM  Baseline: 7-9/10 09/23/23 - Pt denied pain with all updated HEP during affected UE AROM 10/12/23 - Pt reported 2/10 pain between digit 2 and digit 3 metacarpal of L hand. Goal status: MET   LONG TERM GOALS: Target date: 10/26/23  Independent with updated strengthening HEP  Baseline: Dependent d/t current precautions 10/12/23 - Pt demo'd understanding of yellow theraputty digit flex and rubber band digit ext HEP. Goal status: MET   2.  Pt to demo wrist and MP ROM LUE WFL's for all functional tasks Baseline: unable to assess d/t current precautions 10/12/23 - Full composite digit flex of affected UE - WFL with wrist neutral. Needs tenodesis for wrist motion  Goal status: in progress  3.  Pt to perform grip strength Lt hand that is 50% or more compared to Rt dominant hand Baseline: unable to assess d/t current precautions 10/12/23 - initial grip strength testing: LUE: 44 lbs (denied pain), RUE: 85 lbs.  Goal status: MET  4.  Pt to return to using Lt hand as assist for all bilateral tasks Baseline: unable d/t current precautions 10/14/23 - wean down splint to pm only Goal status: in  progress  5.  Quick Dash to improve to 30% deficit or less Baseline: 45.5% Goal status: in progress   ASSESSMENT:  CLINICAL IMPRESSION: Pt making progress towards goals as expected and met all STG's and 2 LTG's at this time. Pt progressing per protocol and now weaned down to wearing splint only at night per protocol. Anticipate d/c next visit  PERFORMANCE DEFICITS: in functional skills including ADLs, IADLs, coordination, dexterity, edema, ROM, strength, pain, Fine motor control, decreased knowledge of precautions, skin integrity, and UE functional use,.   IMPAIRMENTS: are limiting patient from  ADLs, IADLs, work, and social participation.   COMORBIDITIES: may have co-morbidities  that affects occupational performance. Patient will benefit from skilled OT to address above impairments and improve overall function.  MODIFICATION OR ASSISTANCE TO COMPLETE EVALUATION: No modification of tasks or assist necessary to complete an evaluation.  OT OCCUPATIONAL PROFILE AND HISTORY: Problem focused assessment: Including review of records relating to presenting problem.  CLINICAL DECISION MAKING: Moderate - several treatment options, min-mod task modification necessary  REHAB POTENTIAL: Good  EVALUATION COMPLEXITY: Low      PLAN:  OT FREQUENCY: 1x/week  OT DURATION: 2 weeks, followed by 2x/wk for 4 weeks  PLANNED INTERVENTIONS: 97535 self care/ADL training, 10272 therapeutic exercise, 97530 therapeutic activity, 97140 manual therapy, 97035 ultrasound, 97018 paraffin, 53664 fluidotherapy, 97010 moist heat, 97034 contrast bath, 97014 electrical stimulation unattended, 97760 Orthotics management and training, 40347 Splinting (initial encounter), scar mobilization, passive range of motion, compression bandaging, patient/family education, and DME and/or AE instructions  RECOMMENDED OTHER SERVICES: none at this time  CONSULTED AND AGREED WITH PLAN OF CARE: Patient  PLAN FOR NEXT SESSION:  fluido, Neldon Mc reassessment, grip strength reassessment, check remaining goals and d/c next visit    For all possible CPT codes, reference the Planned Interventions line above.     Check all conditions that are expected to impact treatment: {Conditions expected to impact treatment:None of these apply   If treatment provided at initial evaluation, no treatment charged due to lack of authorization.        Sheran Lawless, OT 10/14/2023, 9:55 AM

## 2023-10-21 ENCOUNTER — Ambulatory Visit: Payer: Medicaid Other | Admitting: Occupational Therapy

## 2023-10-21 ENCOUNTER — Telehealth: Payer: Self-pay | Admitting: Occupational Therapy

## 2023-10-21 NOTE — Telephone Encounter (Signed)
Called (no answer) and left message re: his missed last scheduled O.T. appointment today 10/21/23. Pt given the option to reschedule missed appt before POC ends early next week or d/c without assessing remaining goals. Pt provided with phone number to call us back to let us know which he decides. Will d/c episode of care if clinic does not hear back from patient.
